# Patient Record
Sex: Male | Born: 1990 | Race: White | Hispanic: No | Marital: Single | Smoking: Never smoker
Health system: Southern US, Community
[De-identification: ages and names within clinical notes are randomized; demographics above are authoritative.]

## PROBLEM LIST (undated history)

## (undated) VITALS — BP 108/74 | HR 75 | Temp 98.2°F | Resp 16 | Ht 71.0 in | Wt 171.0 lb

## (undated) DIAGNOSIS — Z789 Other specified health status: Secondary | ICD-10-CM

## (undated) DIAGNOSIS — F640 Transsexualism: Secondary | ICD-10-CM

## (undated) HISTORY — PX: NO PAST SURGERIES: SHX2092

---
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
## 2009-08-20 ENCOUNTER — Emergency Department (HOSPITAL_BASED_OUTPATIENT_CLINIC_OR_DEPARTMENT_OTHER): Admission: EM | Admit: 2009-08-20 | Discharge: 2009-08-20 | Payer: Self-pay | Admitting: Emergency Medicine

## 2009-08-20 ENCOUNTER — Ambulatory Visit: Payer: Self-pay | Admitting: Diagnostic Radiology

## 2009-09-11 ENCOUNTER — Ambulatory Visit: Payer: Self-pay | Admitting: Interventional Radiology

## 2009-09-11 ENCOUNTER — Emergency Department (HOSPITAL_BASED_OUTPATIENT_CLINIC_OR_DEPARTMENT_OTHER): Admission: EM | Admit: 2009-09-11 | Discharge: 2009-09-11 | Payer: Self-pay | Admitting: Emergency Medicine

## 2010-12-21 LAB — CBC
Platelets: 278 10*3/uL (ref 150–400)
RDW: 12.4 % (ref 11.5–15.5)
WBC: 5.6 10*3/uL (ref 4.0–10.5)

## 2010-12-21 LAB — DIFFERENTIAL
Basophils Absolute: 0 10*3/uL (ref 0.0–0.1)
Lymphocytes Relative: 30 % (ref 12–46)
Lymphs Abs: 1.7 10*3/uL (ref 0.7–4.0)
Neutro Abs: 3.4 10*3/uL (ref 1.7–7.7)
Neutrophils Relative %: 60 % (ref 43–77)

## 2010-12-21 LAB — BASIC METABOLIC PANEL
Calcium: 9.8 mg/dL (ref 8.4–10.5)
Chloride: 103 mEq/L (ref 96–112)
Creatinine, Ser: 0.9 mg/dL (ref 0.4–1.5)
GFR calc non Af Amer: >60 mL/min (ref >60)

## 2010-12-22 LAB — URINALYSIS, ROUTINE W REFLEX MICROSCOPIC
Ketones, ur: NEGATIVE mg/dL
Nitrite: NEGATIVE
Specific Gravity, Urine: 1.013 (ref 1.005–1.030)
Urobilinogen, UA: 0.2 mg/dL (ref 0.0–1.0)
pH: 7 (ref 5.0–8.0)

## 2010-12-22 LAB — RAPID STREP SCREEN (MED CTR MEBANE ONLY): Streptococcus, Group A Screen (Direct): NEGATIVE

## 2011-06-10 IMAGING — CR DG CHEST 2V
2 series · 2 of 2 positions shown · non-contrast
Comparison: None

CLINICAL DATA: Fever, cough, chest pain

CHEST - 2 VIEW

[w chest pa]
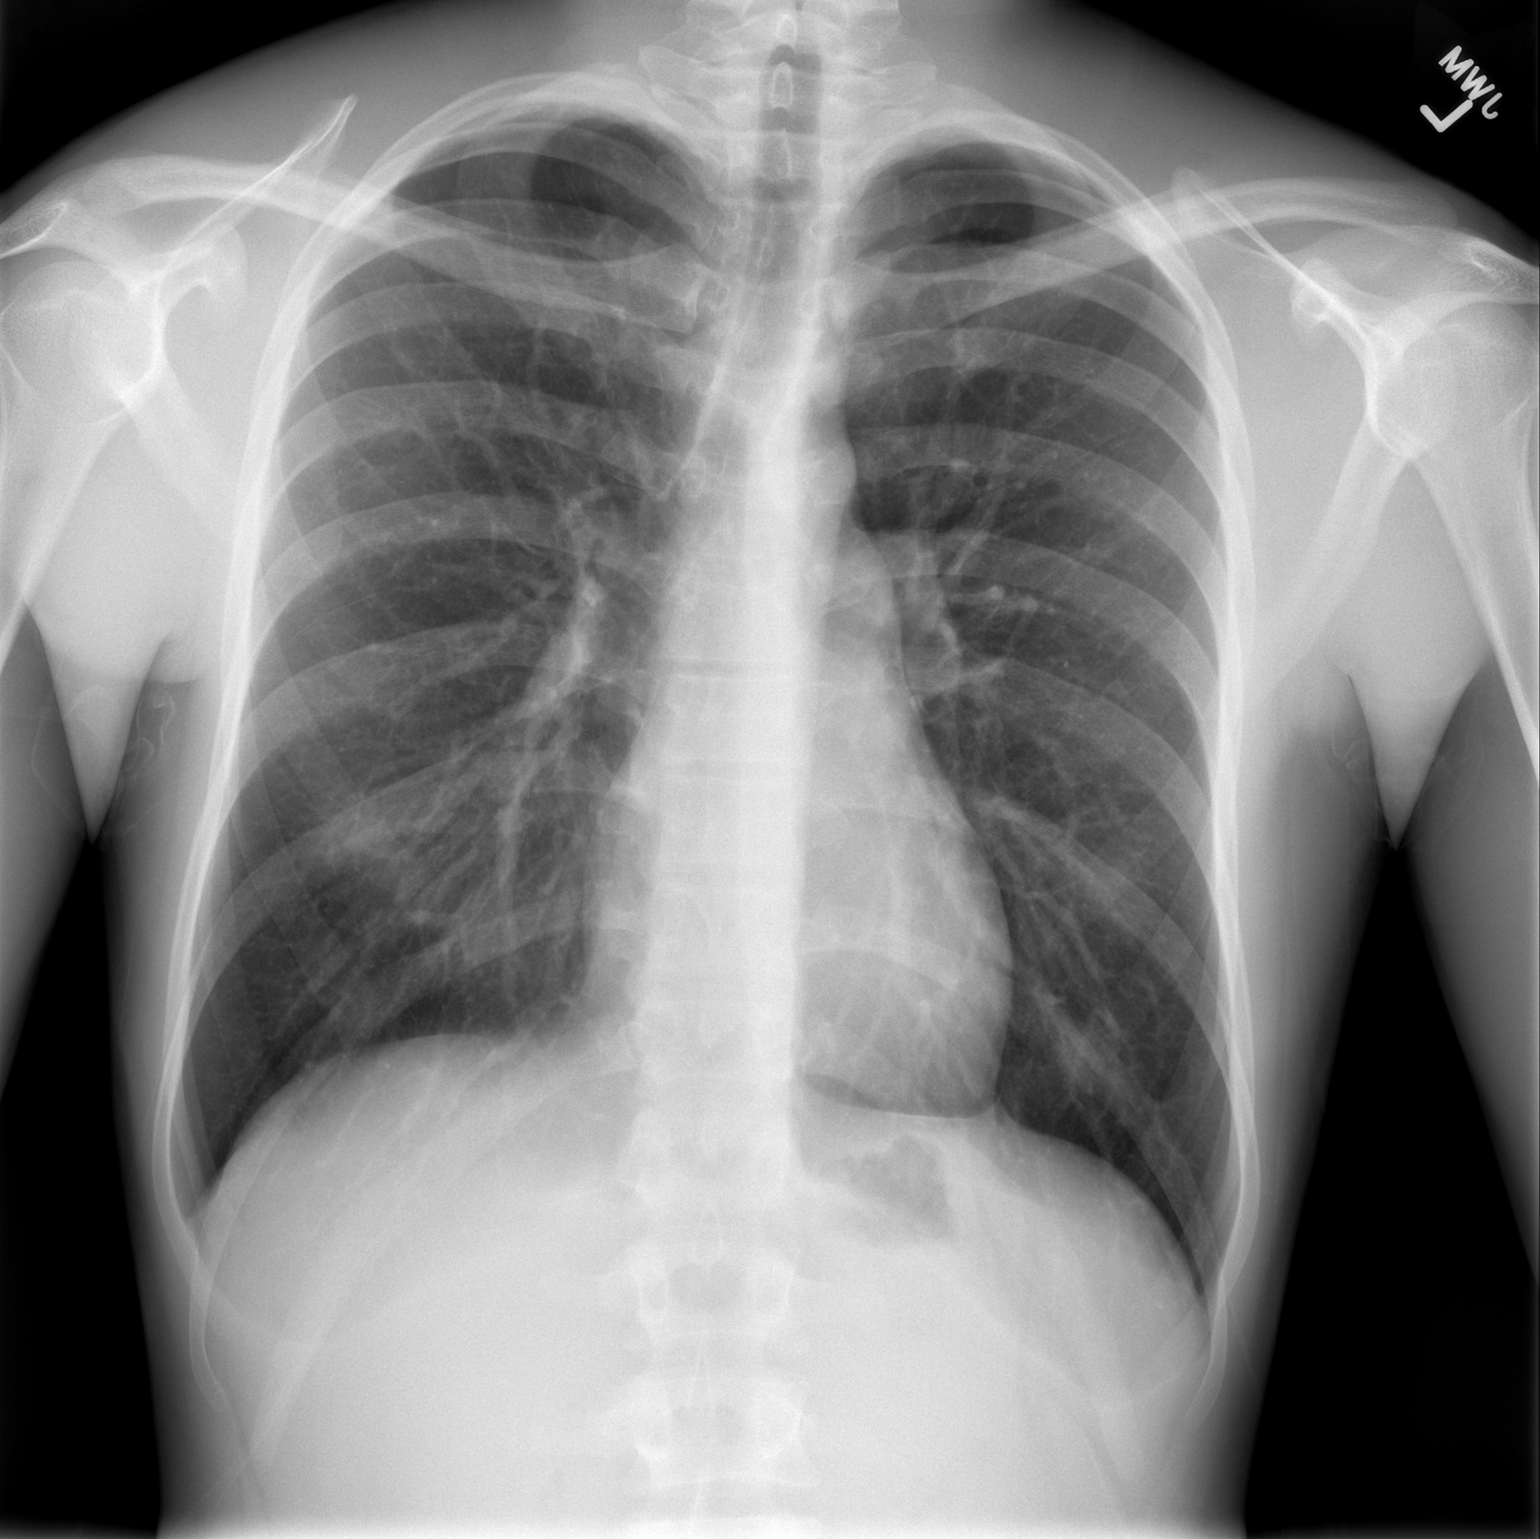

[w chest lat]
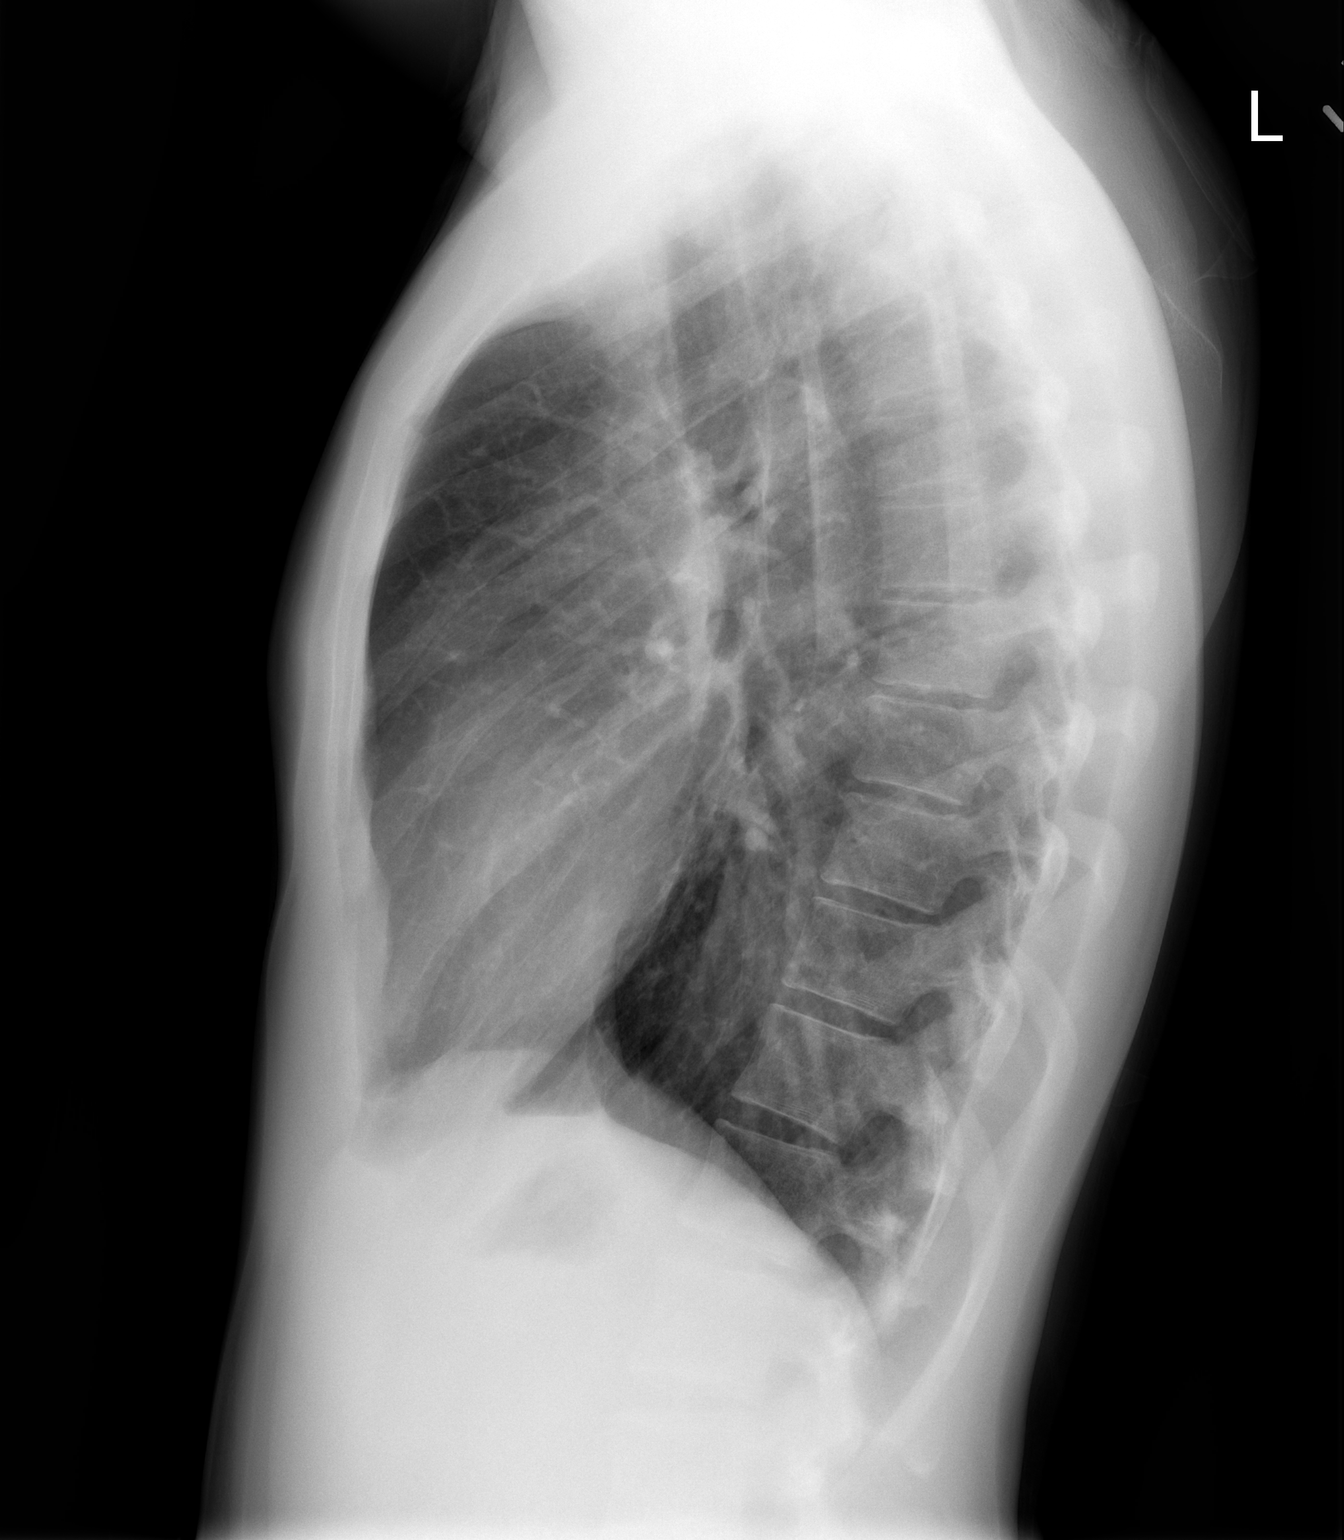

[2 of 2 positions shown; findings below may reference images not displayed]

FINDINGS: The lungs are clear but hyperaerated.  The heart is
within normal limits in size.  No bony abnormality is seen.
IMPRESSION: No active lung disease.  Hyperaeration.

## 2018-08-31 ENCOUNTER — Telehealth: Payer: Self-pay | Admitting: Family Medicine

## 2018-08-31 NOTE — Telephone Encounter (Signed)
Copied from CRM [id number]. Topic: Quick Communication - See Telephone Encounter >> Aug 31, 2018  1:42 PM Jens SomMedley, Jennifer A wrote: CRM for notification. See Telephone encounter for: 08/31/18.  Patient is calling to become a patient of Dr. Clelia CroftShaw was referred by Dr. Ruby ColaPittaway. The Dr is retired. Interested in hormone therapy please advise [phone]

## 2019-03-13 ENCOUNTER — Emergency Department (HOSPITAL_COMMUNITY)
Admission: EM | Admit: 2019-03-13 | Discharge: 2019-03-14 | Disposition: A | Discharge status: Other Acute Inpatient Hospital | Payer: Self-pay | Attending: Emergency Medicine | Admitting: Emergency Medicine

## 2019-03-13 ENCOUNTER — Encounter (HOSPITAL_COMMUNITY): Payer: Self-pay | Admitting: Emergency Medicine

## 2019-03-13 ENCOUNTER — Other Ambulatory Visit: Payer: Self-pay

## 2019-03-13 DIAGNOSIS — Z79899 Other long term (current) drug therapy: Secondary | ICD-10-CM | POA: Insufficient documentation

## 2019-03-13 DIAGNOSIS — F332 Major depressive disorder, recurrent severe without psychotic features: Secondary | ICD-10-CM | POA: Insufficient documentation

## 2019-03-13 DIAGNOSIS — Z03818 Encounter for observation for suspected exposure to other biological agents ruled out: Secondary | ICD-10-CM | POA: Insufficient documentation

## 2019-03-13 DIAGNOSIS — F419 Anxiety disorder, unspecified: Secondary | ICD-10-CM | POA: Insufficient documentation

## 2019-03-13 DIAGNOSIS — F322 Major depressive disorder, single episode, severe without psychotic features: Secondary | ICD-10-CM

## 2019-03-13 HISTORY — DX: Other specified health status: Z78.9

## 2019-03-13 LAB — COMPREHENSIVE METABOLIC PANEL
ALT: 93 U/L — ABNORMAL HIGH (ref 0–44)
AST: 91 U/L — ABNORMAL HIGH (ref 15–41)
Albumin: 4.2 g/dL (ref 3.5–5.0)
Alkaline Phosphatase: 61 U/L (ref 38–126)
Anion gap: 6 (ref 5–15)
BUN: 9 mg/dL (ref 6–20)
CO2: 27 mmol/L (ref 22–32)
Calcium: 9.2 mg/dL (ref 8.9–10.3)
Chloride: 106 mmol/L (ref 98–111)
Creatinine, Ser: 0.85 mg/dL (ref 0.61–1.24)
GFR calc Af Amer: >60 mL/min (ref >60)
GFR calc non Af Amer: >60 mL/min (ref >60)
Glucose, Bld: 89 mg/dL (ref 70–99)
Potassium: 3.6 mmol/L (ref 3.5–5.1)
Sodium: 139 mmol/L (ref 135–145)
Total Bilirubin: 0.8 mg/dL (ref 0.3–1.2)
Total Protein: 6.8 g/dL (ref 6.5–8.1)

## 2019-03-13 LAB — CBC
HCT: 40 % (ref 39.0–52.0)
Hemoglobin: 13.5 g/dL (ref 13.0–17.0)
MCH: 31 pg (ref 26.0–34.0)
MCHC: 33.8 g/dL (ref 30.0–36.0)
MCV: 91.7 fL (ref 80.0–100.0)
Platelets: 342 10*3/uL (ref 150–400)
RBC: 4.36 MIL/uL (ref 4.22–5.81)
RDW: 12.7 % (ref 11.5–15.5)
WBC: 10.5 10*3/uL (ref 4.0–10.5)
nRBC: 0 % (ref 0.0–0.2)

## 2019-03-13 LAB — RAPID URINE DRUG SCREEN, HOSP PERFORMED
Amphetamines: NOT DETECTED
Barbiturates: NOT DETECTED
Benzodiazepines: POSITIVE — AB
Cocaine: NOT DETECTED
Opiates: NOT DETECTED
Tetrahydrocannabinol: NOT DETECTED

## 2019-03-13 LAB — ETHANOL: Alcohol, Ethyl (B): <10 mg/dL (ref <10)

## 2019-03-13 LAB — ACETAMINOPHEN LEVEL: Acetaminophen (Tylenol), Serum: <10 ug/mL — ABNORMAL LOW (ref 10–30)

## 2019-03-13 LAB — SALICYLATE LEVEL: Salicylate Lvl: <7 mg/dL (ref 2.8–30.0)

## 2019-03-13 NOTE — ED Notes (Signed)
Pt changed into scrubs. Pt belongings gathered and placed in locker 3 purple zone. Valuables sealed in envelope and locked up by security. Pt wanded in triage area and given recliner. Pt very cooperative and agreeable.

## 2019-03-13 NOTE — ED Triage Notes (Signed)
Pt states he has recently had a flare in his depression and anxiety for the last few months. Pt is currently on hormone treatments to block Testerone. Pt currently has thoughts of harming self- pt states he has been trying to come up with a plan- pt has thought about getting heroine to over dose.
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
## 2019-03-14 ENCOUNTER — Encounter (HOSPITAL_COMMUNITY): Payer: Self-pay

## 2019-03-14 ENCOUNTER — Inpatient Hospital Stay (HOSPITAL_COMMUNITY)
Admission: AD | Admit: 2019-03-14 | Discharge: 2019-03-18 | DRG: 885 | Disposition: A | Discharge status: Home / Self Care | Payer: Federal, State, Local not specified - Other | Source: Intra-hospital | Attending: Psychiatry | Admitting: Psychiatry

## 2019-03-14 ENCOUNTER — Other Ambulatory Visit: Payer: Self-pay

## 2019-03-14 DIAGNOSIS — Z79899 Other long term (current) drug therapy: Secondary | ICD-10-CM | POA: Diagnosis not present

## 2019-03-14 DIAGNOSIS — F339 Major depressive disorder, recurrent, unspecified: Secondary | ICD-10-CM

## 2019-03-14 DIAGNOSIS — F419 Anxiety disorder, unspecified: Secondary | ICD-10-CM | POA: Diagnosis present

## 2019-03-14 DIAGNOSIS — F332 Major depressive disorder, recurrent severe without psychotic features: Secondary | ICD-10-CM | POA: Diagnosis not present

## 2019-03-14 DIAGNOSIS — G4700 Insomnia, unspecified: Secondary | ICD-10-CM | POA: Diagnosis present

## 2019-03-14 DIAGNOSIS — F640 Transsexualism: Secondary | ICD-10-CM | POA: Diagnosis present

## 2019-03-14 DIAGNOSIS — Z79890 Hormone replacement therapy (postmenopausal): Secondary | ICD-10-CM

## 2019-03-14 DIAGNOSIS — F429 Obsessive-compulsive disorder, unspecified: Secondary | ICD-10-CM | POA: Diagnosis present

## 2019-03-14 DIAGNOSIS — R45851 Suicidal ideations: Secondary | ICD-10-CM | POA: Diagnosis present

## 2019-03-14 HISTORY — DX: Other specified health status: Z78.9

## 2019-03-14 LAB — SARS CORONAVIRUS 2 BY RT PCR (HOSPITAL ORDER, PERFORMED IN ~~LOC~~ HOSPITAL LAB): SARS Coronavirus 2: NEGATIVE

## 2019-03-14 MED ORDER — ESTRADIOL VALERATE 20 MG/ML IM OIL
10.0000 mg | TOPICAL_OIL | INTRAMUSCULAR | Status: DC
  Administered 2019-03-17: 10 mg via INTRAMUSCULAR

## 2019-03-14 MED ORDER — ALUM & MAG HYDROXIDE-SIMETH 200-200-20 MG/5ML PO SUSP: 30.0000 mL | ORAL | Status: DC | PRN

## 2019-03-14 MED ORDER — DUTASTERIDE 0.5 MG PO CAPS: 0.5000 mg | ORAL_CAPSULE | Freq: Every day | ORAL | Status: DC

## 2019-03-14 MED ORDER — DUTASTERIDE 0.5 MG PO CAPS
0.5000 mg | ORAL_CAPSULE | Freq: Every day | ORAL | Status: DC
  Administered 2019-03-15 – 2019-03-18 (×4): 0.5 mg via ORAL

## 2019-03-14 MED ORDER — TRAZODONE HCL 50 MG PO TABS
50.0000 mg | ORAL_TABLET | Freq: Every evening | ORAL | Status: DC | PRN
  Administered 2019-03-14 – 2019-03-17 (×4): 50 mg via ORAL
  Filled 2019-03-14 (×3): qty 1
  Filled 2019-03-14: qty 7
  Filled 2019-03-14: qty 1

## 2019-03-14 MED ORDER — SERTRALINE HCL 50 MG PO TABS
50.0000 mg | ORAL_TABLET | Freq: Every day | ORAL | Status: DC
  Administered 2019-03-15: 08:00:00 50 mg via ORAL
  Filled 2019-03-14 (×2): qty 1

## 2019-03-14 MED ORDER — LORAZEPAM 0.5 MG PO TABS: 0.5000 mg | ORAL_TABLET | Freq: Four times a day (QID) | ORAL | Status: DC | PRN

## 2019-03-14 MED ORDER — ACETAMINOPHEN 325 MG PO TABS: 650.0000 mg | ORAL_TABLET | Freq: Four times a day (QID) | ORAL | Status: DC | PRN

## 2019-03-14 MED ORDER — HYDROXYZINE HCL 25 MG PO TABS
25.0000 mg | ORAL_TABLET | Freq: Three times a day (TID) | ORAL | Status: DC | PRN
  Administered 2019-03-14 – 2019-03-17 (×4): 25 mg via ORAL
  Filled 2019-03-14: qty 1
  Filled 2019-03-14: qty 10
  Filled 2019-03-14 (×3): qty 1

## 2019-03-14 MED ORDER — TRAZODONE HCL 50 MG PO TABS: 50.0000 mg | ORAL_TABLET | Freq: Every evening | ORAL | Status: DC | PRN

## 2019-03-14 MED ORDER — MAGNESIUM HYDROXIDE 400 MG/5ML PO SUSP: 30.0000 mL | Freq: Every day | ORAL | Status: DC | PRN

## 2019-03-14 NOTE — Progress Notes (Signed)
Patient ID: Paul [name], male   DOB: 1991/03/20, 28 y.o.   MRN: [id number]  Admission Note  D) Patient admitted to the adult unit 400 hall at 1315 voluntarily. Patient is a 28 year old male from [facility]. Patient states he is transitioning to become male but prefers male pronouns and the name Paul [name] at this time. Patient presents anxious but was pleasant and cooperative during the admission process. Patient states he is living with roommates but has felt an increase in OCD symptoms and depression that have become debilitating. Patient reports past SI to OD on heroin (although he denies substance abuse) but states he is not suicidal now. Patient denies HI/AVH. While here, patient reports wanting to work on "my suicidal thoughts, hopelessness, OCD and depression".   Skin assessment was completed and unremarkable except for a healing sunburn. Patient belongings searched with no contraband found. Belongings in locker #41. Valuables envelope received from [facility]. Vital signs obtained and were stable. Snacks and fluids offered.    A) Plan of care, unit policies and patient expectations were explained. Written consents obtained. Patient oriented to the unit and their room. Patient placed on standard q15 safety checks. Low fall risk precautions initiated and reviewed with patient. Home medications reviewed with pharmacy.   R) Patient is in no acute distress and verbalizes understanding of information provided. Patient with no concerns at this time. Patient contracts for safety with staff on the unit. Report given to receiving RN.

## 2019-03-14 NOTE — ED Notes (Signed)
Lindon Romp, NP, determined pt meets criteria for inpatient hospitalization. Pt has been accepted at [facility] Room 021-1 and can arrive at 0900. Pt *must* have a COVID test performed prior to being transported from [facility]. Pt's nurse, Stefani Dama, was provided this information at [phone].  Accepting: Lindon Romp, NP Attending: Dr. Suzy Bouchard Call to Report: [phone]
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
## 2019-03-14 NOTE — ED Notes (Signed)
Plan of care: to be inpatient, voluntary.

## 2019-03-14 NOTE — Progress Notes (Signed)
Pt accepted to  [facility]. 406-2 Mordecai Maes, NP is the accepting provider.  Neita Garnet, MD is the attending provider.  Call report to [place] @ [facility] ED notified.   Pt is  Voluntary.  Pt may be transported by [facility]  Pt scheduled  to arrive at [facility] as soon as transport can be arranged.  Areatha Keas. Judi Cong, MSW, [place] Disposition Clinical Social Work [phone] (cell) [phone] (office)

## 2019-03-14 NOTE — Tx Team (Signed)
Initial Treatment Plan 03/14/2019 1:55 PM Paul [name] JEH:[id number]    PATIENT STRESSORS: Other: untreated symptoms of depression/OCD   PATIENT STRENGTHS: Ability for insight Average or above average intelligence Capable of independent living Motivation for treatment/growth Physical Health Supportive family/friends   PATIENT IDENTIFIED PROBLEMS: "Hopelessness"  "Depression"  "OCD"  Suicide Risk               DISCHARGE CRITERIA:  Ability to meet basic life and health needs Adequate post-discharge living arrangements Improved stabilization in mood, thinking, and/or behavior Medical problems require only outpatient monitoring Motivation to continue treatment in a less acute level of care Need for constant or close observation no longer present Reduction of life-threatening or endangering symptoms to within safe limits Safe-care adequate arrangements made Verbal commitment to aftercare and medication compliance  PRELIMINARY DISCHARGE PLAN: Outpatient therapy  PATIENT/FAMILY INVOLVEMENT: This treatment plan has been presented to and reviewed with the patient, Paul [name].  The patient and family have been given the opportunity to ask questions and make suggestions.  Annia Friendly, RN 03/14/2019, 1:55 PM

## 2019-03-14 NOTE — Tx Team (Signed)
Initial Treatment Plan 03/14/2019 1:48 PM [name] [facility]:[id number]    PATIENT STRESSORS: Health problems Traumatic event   PATIENT STRENGTHS: Ability for insight Active sense of humor Average or above average intelligence   PATIENT IDENTIFIED PROBLEMS: "OCD"  anxiety                   DISCHARGE CRITERIA:  Ability to meet basic life and health needs Adequate post-discharge living arrangements Improved stabilization in mood, thinking, and/or behavior  PRELIMINARY DISCHARGE PLAN: Outpatient therapy  PATIENT/FAMILY INVOLVEMENT: This treatment plan has been presented to and reviewed with the patient, Paul [name]. The patient and family have been given the opportunity to ask questions and make suggestions.  Megan Mans, RN 03/14/2019, 1:48 PM

## 2019-03-14 NOTE — H&P (Addendum)
Psychiatric Admission Assessment Adult  Patient Identification: Paul [name] MRN:  [id number] Date of Evaluation:  03/14/2019 Chief Complaint:  "The OCD has really gotten bad." Principal Diagnosis: <principal problem not specified> Diagnosis:  Active Problems:   Episode of recurrent major depressive disorder ([place])  History of Present Illness: Paul [name] is a 28 year old with history of MDD, OCD. He is a male transitioning to male on hormone therapy but still prefers he/him pronouns and to be called Paul [name] at this time. He reports increased OCD symptoms over the last several weeks, including frequent handwashing, checking doors/locks/faucets/lights, adjusting blinds in windows, cleaning feet. He reports obsessions and compulsive behaviors are "taking over my life," causing stress, taking up large amounts of time, and have brought him to tears. 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 He reports anxiety has also increased related to being at home alone more with the pandemic. He is transitioning to male with hormone therapy and reports depression related to his mother's difficulty accepting transgender status, as well as people at his old church telling him that being transgender is a sin. He wants to be male but is unsure if he believes this is wrong based on religious teachings. 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 He states he has taken Xanax about three times in the last two weeks, with last dose two days ago. He reports suicidal ideation with thoughts of obtaining a large dose of heroin to end his life, but denies history of heroin use. Denies other drug/alcohol use. UDS positive for BZDs only. AST, ALT elevated (91, 93). He denies known history of liver dysfunction. Denies history of IV drug use. He is taking estrogen and Adovert. He was taking  spironolactone but stopped two weeks ago related to insomnia. He reports insomnia has improved since that time but still has difficulty sleeping.  Associated Signs/Symptoms: Depression Symptoms:  depressed mood, anhedonia, insomnia, fatigue, hopelessness, suicidal thoughts with specific plan, decreased appetite, (Hypo) Manic Symptoms:  Impulsivity, Irritable Mood, Anxiety Symptoms:  Excessive Worry, Obsessive Compulsive Symptoms:   Checking, Counting, Handwashing,, Psychotic Symptoms:  denies PTSD Symptoms: Negative Total Time spent with patient: 45 minutes  Past Psychiatric History: Long history of OCD and depression. Previously treated with Prozac but discontinued due to feeling emotionally numb. Paxil was discontinued due to side effects. History of punching himself. Denies prior hospitalizations or suicide attempts. Denies history of manic or psychotic symptoms.  Is the patient at risk to self? Yes.    Has the patient been a risk to self in the past 6 months? No.  Has the patient been a risk to self within the distant past? No.  Is the patient a risk to others? No.  Has the patient been a risk to others in the past 6 months? No.  Has the patient been a risk to others within the distant past? No.   Prior Inpatient Therapy:   Prior Outpatient Therapy:    Alcohol Screening: 1. How often do you have a drink containing alcohol?: Never 2. How many drinks containing alcohol do you have on a typical day when you are drinking?: 1 or 2 3. How often do you have six or more drinks on one occasion?: Never AUDIT-C Score: 0 4. How often during the last year have you found that you were not able to stop  drinking once you had started?: Never 5. How often during the last year have you failed to do what was normally expected from you becasue of drinking?: Never 6. How often during the last year have you needed a first drink in the morning to get yourself going after a heavy drinking session?:  Never 7. How often during the last year have you had a feeling of guilt of remorse after drinking?: Never 8. How often during the last year have you been unable to remember what happened the night before because you had been drinking?: Never 9. Have you or someone else been injured as a result of your drinking?: No 10. Has a relative or friend or a doctor or another health worker been concerned about your drinking or suggested you cut down?: No Alcohol Use Disorder Identification Test Final Score (AUDIT): 0 Alcohol Brief Interventions/Follow-up: Patient Refused Substance Abuse History in the last 12 months:  Yes.   Consequences of Substance Abuse: Denies Previous Psychotropic Medications: Yes  Psychological Evaluations: No  Past Medical History:  Past Medical History:  Diagnosis Date  . Male-to-male transgender person   . Medical history non-contributory     Past Surgical History:  Procedure Laterality Date  . NO PAST SURGERIES     Family History: History reviewed. No pertinent family history. Family Psychiatric  History: He is adopted. He thinks his biological mother might have had problems with alcohol. Tobacco Screening: Have you used any form of tobacco in the last 30 days? (Cigarettes, Smokeless Tobacco, Cigars, and/or Pipes): No Social History:  Social History   Substance and Sexual Activity  Alcohol Use Not Currently     Social History   Substance and Sexual Activity  Drug Use Never    Additional Social History:                           Allergies:  No Known Allergies Lab Results:  Results for orders placed or performed during the hospital encounter of 03/13/19 (from the past 48 hour(s))  Comprehensive metabolic panel     Status: Abnormal   Collection Time: 03/13/19  6:22 PM  Result Value Ref Range   Sodium 139 135 - 145 mmol/L   Potassium 3.6 3.5 - 5.1 mmol/L   Chloride 106 98 - 111 mmol/L   CO2 27 22 - 32 mmol/L   Glucose, Bld 89 70 - 99 mg/dL    BUN 9 6 - 20 mg/dL   Creatinine, Ser 0.85 0.61 - 1.24 mg/dL   Calcium 9.2 8.9 - 10.3 mg/dL   Total Protein 6.8 6.5 - 8.1 g/dL   Albumin 4.2 3.5 - 5.0 g/dL   AST 91 (H) 15 - 41 U/L   ALT 93 (H) 0 - 44 U/L   Alkaline Phosphatase 61 38 - 126 U/L   Total Bilirubin 0.8 0.3 - 1.2 mg/dL   GFR calc non Af Amer >60 >60 mL/min   GFR calc Af Amer >60 >60 mL/min   Anion gap 6 5 - 15    Comment: Performed at [facility], [address], [place], [place] [address]  Ethanol     Status: None   Collection Time: 03/13/19  6:22 PM  Result Value Ref Range   Alcohol, Ethyl (B) <10 <10 mg/dL    Comment: (NOTE) Lowest detectable limit for serum alcohol is 10 mg/dL. For medical purposes only. Performed at [facility], [place] [address], [place], [place]  [address]   Salicylate level     Status: None   Collection Time: 03/13/19  6:22 PM  Result Value Ref Range   Salicylate Lvl <6.0 2.8 - 30.0 mg/dL    Comment: Performed at [facility] [address], [place], [place] [address]  Acetaminophen level     Status: Abnormal   Collection Time: 03/13/19  6:22 PM  Result Value Ref Range   Acetaminophen (Tylenol), Serum <10 (L) 10 - 30 ug/mL    Comment: (NOTE) Therapeutic concentrations vary significantly. A range of 10-30 ug/mL  may be an effective concentration for many patients. However, some  are best treated at concentrations outside of this range. Acetaminophen concentrations >150 ug/mL at 4 hours after ingestion  and >50 ug/mL at 12 hours after ingestion are often associated with  toxic reactions. Performed at [facility], [place] [address], [place], [place] [address]   cbc     Status: None   Collection Time: 03/13/19  6:22 PM  Result Value Ref Range   WBC 10.5 4.0 - 10.5 K/uL   RBC 4.36 4.22 - 5.81 MIL/uL   Hemoglobin 13.5 13.0 - 17.0 g/dL   HCT 40.0 39.0 - 52.0 %   MCV 91.7 80.0 - 100.0 fL   MCH 31.0 26.0 - 34.0 pg   MCHC 33.8 30.0 - 36.0 g/dL   RDW 12.7 11.5 - 15.5 %    Platelets 342 150 - 400 K/uL   nRBC 0.0 0.0 - 0.2 %    Comment: Performed at [facility], [place] [address], [place], [place] [address]  Rapid urine drug screen (hospital performed)     Status: Abnormal   Collection Time: 03/13/19  7:15 PM  Result Value Ref Range   Opiates NONE DETECTED NONE DETECTED   Cocaine NONE DETECTED NONE DETECTED   Benzodiazepines POSITIVE (A) NONE DETECTED   Amphetamines NONE DETECTED NONE DETECTED   Tetrahydrocannabinol NONE DETECTED NONE DETECTED   Barbiturates NONE DETECTED NONE DETECTED    Comment: (NOTE) DRUG SCREEN FOR MEDICAL PURPOSES ONLY.  IF CONFIRMATION IS NEEDED FOR ANY PURPOSE, NOTIFY LAB WITHIN 5 DAYS. LOWEST DETECTABLE LIMITS FOR URINE DRUG SCREEN Drug Class                     Cutoff (ng/mL) Amphetamine and metabolites    1000 Barbiturate and metabolites    200 Benzodiazepine                 562 Tricyclics and metabolites     300 Opiates and metabolites        300 Cocaine and metabolites        300 THC                            50 Performed at [facility], [place] [address], [place], [place] [address]   SARS Coronavirus 2 (CEPHEID - Performed in [facility] lab), Hosp Order     Status: None   Collection Time: 03/14/19  7:57 AM   Specimen: Nasopharyngeal Swab  Result Value Ref Range   SARS Coronavirus 2 NEGATIVE NEGATIVE    Comment: (NOTE) If result is NEGATIVE SARS-CoV-2 target nucleic acids are NOT DETECTED. The SARS-CoV-2 RNA is generally detectable in upper and lower  respiratory specimens during the acute phase of infection. The lowest  concentration of SARS-CoV-2 viral copies this assay can detect is 250  copies /  mL. A negative result does not preclude SARS-CoV-2 infection  and should not be used as the sole basis for treatment or other  patient management decisions.  A negative result may occur with  improper specimen collection / handling, submission of specimen other  than nasopharyngeal swab,  presence of viral mutation(s) within the  areas targeted by this assay, and inadequate number of viral copies  (<250 copies / mL). A negative result must be combined with clinical  observations, patient history, and epidemiological information. If result is POSITIVE SARS-CoV-2 target nucleic acids are DETECTED. The SARS-CoV-2 RNA is generally detectable in upper and lower  respiratory specimens dur ing the acute phase of infection.  Positive  results are indicative of active infection with SARS-CoV-2.  Clinical  correlation with patient history and other diagnostic information is  necessary to determine patient infection status.  Positive results do  not rule out bacterial infection or co-infection with other viruses. If result is PRESUMPTIVE POSTIVE SARS-CoV-2 nucleic acids MAY BE PRESENT.   A presumptive positive result was obtained on the submitted specimen  and confirmed on repeat testing.  While 2019 novel coronavirus  (SARS-CoV-2) nucleic acids may be present in the submitted sample  additional confirmatory testing may be necessary for epidemiological  and / or clinical management purposes  to differentiate between  SARS-CoV-2 and other Sarbecovirus currently known to infect humans.  If clinically indicated additional testing with an alternate test  methodology [phone]) is advised. The SARS-CoV-2 RNA is generally  detectable in upper and lower respiratory sp ecimens during the acute  phase of infection. The expected result is Negative. Fact Sheet for Patients:  StrictlyIdeas.no Fact Sheet for Healthcare Providers: BankingDealers.co.za This test is not yet approved or cleared by the [place] FDA and has been authorized for detection and/or diagnosis of SARS-CoV-2 by FDA under an Emergency Use Authorization (EUA).  This EUA will remain in effect (meaning this test can be used) for the duration of the COVID-19 declaration under  Section 564(b)(1) of the Act, 21 U.S.C. section 360bbb-3(b)(1), unless the authorization is terminated or revoked sooner. Performed at [facility], [place] [address] [place], [place] [address]     Blood Alcohol level:  Lab Results  Component Value Date   ETH <10 79/10/4095    Metabolic Disorder Labs:  No results found for: HGBA1C, MPG No results found for: PROLACTIN No results found for: CHOL, TRIG, HDL, CHOLHDL, VLDL, LDLCALC  Current Medications: Current Facility-Administered Medications  Medication Dose Route Frequency Provider Last Rate Last Dose  . [START ON 03/15/2019] dutasteride (AVODART) capsule 0.5 mg  0.5 mg Oral Daily Estanislao Harmon, Myer Peer, MD      . Derrill Memo ON 03/17/2019] estradiol valerate (DELESTROGEN) 20 MG/ML injection 10 mg  10 mg Intramuscular Weekly Aziel Morgan, Myer Peer, MD      . LORazepam (ATIVAN) tablet 0.5 mg  0.5 mg Oral Q6H PRN Melquiades Kovar, Myer Peer, MD      . Derrill Memo ON 03/15/2019] sertraline (ZOLOFT) tablet 50 mg  50 mg Oral Daily Laira Penninger, Myer Peer, MD      . traZODone (DESYREL) tablet 50 mg  50 mg Oral QHS PRN Lequan Dobratz, Myer Peer, MD       PTA Medications: Medications Prior to Admission  Medication Sig Dispense Refill Last Dose  . dutasteride (AVODART) 0.5 MG capsule Take 0.5 mg by mouth daily.     Marland Kitchen ESTRADIOL VALERATE IM Inject 0.5 mLs into the muscle every Saturday. 142m/5mL Concentration  Musculoskeletal: Strength & Muscle Tone: within normal limits Gait & Station: normal Patient leans: N/A  Psychiatric Specialty Exam: Physical Exam  Nursing note and vitals reviewed. Constitutional: He is oriented to person, place, and time. He appears well-developed and well-nourished.  Cardiovascular: Normal rate.  Respiratory: Effort normal.  Neurological: He is alert and oriented to person, place, and time.    Review of Systems  Constitutional: Negative.   Respiratory: Negative for cough and shortness of breath.   Cardiovascular: Negative for chest pain.   Gastrointestinal: Negative for abdominal pain, diarrhea, nausea and vomiting.  Neurological: Negative for tremors and headaches.  Psychiatric/Behavioral: Positive for depression, substance abuse (Xanax) and suicidal ideas. Negative for hallucinations. The patient is not nervous/anxious and does not have insomnia.     Blood pressure 113/74, pulse 69, temperature 98.4 F (36.9 C), temperature source Oral, resp. rate 18, height _0  (1.803 m), weight 71.7 kg, SpO2 100 %.Body mass index is 22.04 kg/m.  See MD's admission SRA    Treatment Plan Summary: Daily contact with patient to assess and evaluate symptoms and progress in treatment and Medication management   Inpatient hospitalization.  See MD's admission SRA for medication management.  Patient will participate in the therapeutic group milieu.  Discharge disposition in progress.   Observation Level/Precautions:  15 minute checks  Laboratory:  Reviewed  Psychotherapy:  Group therapy  Medications:  See MAR  Consultations:  PRN  Discharge Concerns:  Safety and stabilization  Estimated LOS: 3-5 days  Other:     Physician Treatment Plan for Primary Diagnosis: <principal problem not specified> Long Term Goal(s): Improvement in symptoms so as ready for discharge  Short Term Goals: Ability to identify changes in lifestyle to reduce recurrence of condition will improve, Ability to verbalize feelings will improve and Ability to disclose and discuss suicidal ideas  Physician Treatment Plan for Secondary Diagnosis: Active Problems:   Episode of recurrent major depressive disorder ([place])  Long Term Goal(s): Improvement in symptoms so as ready for discharge  Short Term Goals: Ability to demonstrate self-control will improve and Ability to identify and develop effective coping behaviors will improve  I certify that inpatient services furnished can reasonably be expected to improve the patient's condition.    Connye Burkitt,  NP 6/24/20205:34 PM   I have discussed case with NP and have met with patient  Agree with NP note and assessment  27, single,identifies as male transitioning to male.  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 Presented to hospital voluntarily . Reports worsening anxiety and  depression. Describes OCD type symptoms such as increased obsessions regarding faucets, light switches, water getting on the floor, leading to checking/rechecking behaviors. States " for example if the light is on and I turn it off, I have to look at the switch and repeat it is O-F- F, and even then I need to come back and check again". Endorses some neuro-vegetative symptoms- describes anhedonia, depression, decreased energy, erratic appetite. Also reports recent suicidal ideations, and has had thoughts of intentionally overdosing on heroin. ( Denies opiate /heroin abuse ). Denies psychotic symptoms.  No prior psychiatric admissions, denies history of suicide attempts, denies history of self cutting, does endorse past history of self punching. No history of psychosis. Reports history of chronic depression over the last 2-3 years, although does endorse periods of improvement. Does not endorse any clear history of mania. As noted, endorses  history of OCD type symptoms. In the past was tried on Prozac and on Paxil.States Paxil caused withdrawal symptoms when stopped. Denies medical illnesses, NKDA. Does not smoke. Denies alcohol abuse or drug abuse . 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 States has taken less than before and states has used 2 x ( at 0.5 mgr)  over the last 2 weeks or so, states " it kind of stopped working for me". Home medications- Estradiol, Dutasteride . Recently stopped Spironolactone. Patient reports was adopted at age 51. States biological mother had history of alcohol use disorder.  Dx- MDD, consider OCD .  Plan-- Inpatient  admission. We discussed medication options- states interested in Zoloft, particularly. States " I have heard good things about it". Start Zoloft 50 mgrs QDAY. Trazodone 50 mgrs QHS PRN for insomnia Reports using Xanax x 2 times over the last two weeks and is not presenting with symptoms of WDL. No current indication for detox protocol. Will start Ativan 0.5 mgrs Q 6 hours PRN for anxiety as needed.  Check TSH.

## 2019-03-14 NOTE — ED Notes (Signed)
TTS at bedside. 

## 2019-03-14 NOTE — ED Notes (Signed)
Pt awake and alert.  Calm and very pleasant.  States he can't sleep but otherwise is having no current difficulty.

## 2019-03-14 NOTE — BH Assessment (Addendum)
Tele Assessment Note   Patient Name: Paul [name] MRN: [id number] Referring Physician: Dr. Thayer Jew, MD Location of Patient: [facility] Location of Provider: [facility]  Cire Clute is a 28 y.o. male who came to [facility] due to having ongoing OCD symptoms that have recently "flared up really bad." Pt states he has had OCD symptoms for several years but that he was only recently diagnosed with the d/o. Pt shares things have been "terrible" since 2017 and that he'll cry and scream due to having such severe symptoms, which include turning on and off his light, checking and re-checking his faucets, not wanting to touch anything, not wanting others to touch anything, cleaning and re-cleaning surfaces, etc. 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 He shares he had such horrible despair and depression that he began to consider killing himself; pt began considering ways to kill himself and decided that purchasing heroin and shooting up for an overdose would be easiest, though pt is not a heroin user.  Pt shares he has never attempted to kill himself and he has never been hospitalized for mental health reasons in the past. He denies SI, AH, and engagement in the legal system. Pt states he once experienced VH due to a lack of sleep. He shares he has access to knives for weapons but denies he has access for guns. Pt has a hx of using Adderal, marijuana, and Xanax, though he has no SA treatment hx.  Pt declined to provide clinician verbal consent to contact anyone for collateral, stating he provided more information to clinician than anyone else knows.  Pt is oriented x4. His recent and remote memory is intact. Pt was friendly and cooperative throughout the assessment process. Pt's insight, judgement, and impulse control is impaired at this time.   Diagnosis: F42, Obsessive-compulsive disorder, F33.2, Major depressive disorder, Recurrent  episode, Severe   Past Medical History:  Past Medical History:  Diagnosis Date  . Male-to-male transgender person     History reviewed. No pertinent surgical history.  Family History: No family history on file.  Social History:  has no history on file for tobacco, alcohol, and drug.  Additional Social History:  Alcohol / Drug Use Pain Medications: Please see MAR Prescriptions: Please see MAR Over the Counter: Please see MAR History of alcohol / drug use?: Yes Longest period of sobriety (when/how long): Unknown Substance #1 Name of Substance 1: Adderall 1 - Age of First Use: Unknown 1 - Amount (size/oz): Unknown 1 - Frequency: Unknown 1 - Duration: Unknown 1 - Last Use / Amount: Unknown Substance #2 Name of Substance 2: Marijuana 2 - Age of First Use: Unknown 2 - Amount (size/oz): Unknown 2 - Frequency: Unknown 2 - Duration: Unknown 2 - Last Use / Amount: Unknown Substance #3 Name of Substance 3: Xanax 3 - Age of First Use: Unknown 3 - Amount (size/oz): Unknown 3 - Frequency: Unknown 3 - Duration: Unknown 3 - Last Use / Amount: Unknown  CIWA: CIWA-Ar BP: 134/80 Pulse Rate: 70 COWS:    Allergies: No Known Allergies  Home Medications: (Not in a hospital admission)   OB/GYN Status:  No LMP for male patient.  General Assessment Data Assessment unable to be completed: Yes Reason for not completing assessment: Multiple walk-ins/assessments ordered simultaneously Location of Assessment: [facility] ED TTS Assessment: In system Is this a Tele or Face-to-Face Assessment?: Tele Assessment Is this an Initial Assessment or a  Re-assessment for this encounter?: Initial Assessment Patient Accompanied by:: N/A Language Other than English: No Living Arrangements: Other (Comment)(Pt lives with a married couple friend of the family) What gender do you identify as?: (Currently transitioning MTF, prefers male pronouns ATM) Marital status: Single Maiden name: Laura Pregnancy  Status: No Living Arrangements: Non-relatives/Friends Can pt return to current living arrangement?: Yes Admission Status: Voluntary Is patient capable of signing voluntary admission?: Yes Referral Source: Self/Family/Friend Insurance type: None     Crisis Care Plan Living Arrangements: Non-relatives/Friends Legal Guardian: Other:(Self) Name of Psychiatrist: None Name of Therapist: None  Education Status Is patient currently in school?: No Is the patient employed, unemployed or receiving disability?: Unemployed  Risk to self with the past 6 months Suicidal Ideation: Yes-Currently Present Has patient been a risk to self within the past 6 months prior to admission? : No Suicidal Intent: Yes-Currently Present Has patient had any suicidal intent within the past 6 months prior to admission? : No Is patient at risk for suicide?: Yes Suicidal Plan?: Yes-Currently Present Has patient had any suicidal plan within the past 6 months prior to admission? : No Specify Current Suicidal Plan: Pt has thought about o/d on heroin (though hasn't used) Access to Means: Yes Specify Access to Suicidal Means: Pt has a friend who has used heroin, has access What has been your use of drugs/alcohol within the last 12 months?: Pt acknowledges some use of prescription substances, marijuana Previous Attempts/Gestures: No How many times?: 0 Other Self Harm Risks: Pt acknowledges impulsivity and OCD behaviors Triggers for Past Attempts: None known Intentional Self Injurious Behavior: Cutting, Bruising Comment - Self Injurious Behavior: Pt has engaged in NSSIB via cutting and bruising Family Suicide History: No Recent stressful life event(s): Other (Comment)(Transitioning MTF, lack of healthy coping skills, OCD) Persecutory voices/beliefs?: No Depression: Yes Depression Symptoms: Despondent, Insomnia, Tearfulness, Isolating, Fatigue, Guilt, Feeling worthless/self pity, Feeling angry/irritable Substance  abuse history and/or treatment for substance abuse?: No Suicide prevention information given to non-admitted patients: Not applicable  Risk to Others within the past 6 months Homicidal Ideation: No Does patient have any lifetime risk of violence toward others beyond the six months prior to admission? : No Thoughts of Harm to Others: No Current Homicidal Intent: No Current Homicidal Plan: No Access to Homicidal Means: No Identified Victim: None noted History of harm to others?: No Assessment of Violence: On admission Violent Behavior Description: None noted Does patient have access to weapons?: Yes (Comment)(Pt states he has access to knives) Criminal Charges Pending?: No Does patient have a court date: No Is patient on probation?: No  Psychosis Hallucinations: None noted Delusions: None noted  Mental Status Report Appearance/Hygiene: In scrubs Eye Contact: Good Motor Activity: Unremarkable Speech: Logical/coherent Level of Consciousness: Alert Mood: Depressed, Sullen Affect: Appropriate to circumstance Anxiety Level: Moderate Thought Processes: Coherent, Relevant Judgement: Partial Orientation: Person, Place, Time, Situation Obsessive Compulsive Thoughts/Behaviors: Severe  Cognitive Functioning Concentration: Fair Memory: Recent Intact, Remote Intact Is patient IDD: No Insight: Fair Impulse Control: Poor Appetite: Fair Have you had any weight changes? : No Change Sleep: Increased Total Hours of Sleep: 8 Vegetative Symptoms: None  ADLScreening [facility] Assessment Services) Patient's cognitive ability adequate to safely complete daily activities?: Yes Patient able to express need for assistance with ADLs?: Yes Independently performs ADLs?: Yes (appropriate for developmental age)  Prior Inpatient Therapy Prior Inpatient Therapy: No  Prior Outpatient Therapy Prior Outpatient Therapy: No Does patient have an ACCT team?: No Does patient have Intensive In-House  Services?  :  No Does patient have [facility] services? : No Does patient have P4CC services?: No  ADL Screening (condition at time of admission) Patient's cognitive ability adequate to safely complete daily activities?: Yes Is the patient deaf or have difficulty hearing?: No Does the patient have difficulty seeing, even when wearing glasses/contacts?: No Does the patient have difficulty concentrating, remembering, or making decisions?: Yes Patient able to express need for assistance with ADLs?: Yes Does the patient have difficulty dressing or bathing?: No Independently performs ADLs?: Yes (appropriate for developmental age) Does the patient have difficulty walking or climbing stairs?: No Weakness of Legs: None Weakness of Arms/Hands: None  Home Assistive Devices/Equipment Home Assistive Devices/Equipment: None  Therapy Consults (therapy consults require a physician order) PT Evaluation Needed: No OT Evalulation Needed: No SLP Evaluation Needed: No Abuse/Neglect Assessment (Assessment to be complete while patient is alone) Abuse/Neglect Assessment Can Be Completed: Yes Physical Abuse: Denies Verbal Abuse: Denies Sexual Abuse: Denies Exploitation of patient/patient's resources: Denies Self-Neglect: Denies Values / Beliefs Cultural Requests During Hospitalization: None Spiritual Requests During Hospitalization: None Consults Spiritual Care Consult Needed: No Social Work Consult Needed: No Regulatory affairs officer (For Healthcare) Does Patient Have a Medical Advance Directive?: No Would patient like information on creating a medical advance directive?: No - Patient declined        Disposition: Lindon Romp, NP, reviewed pt's chart and information and determined pt meets criteria for inpatient hospitalization. A 400-Hall bed will be available for pt later today at [facility]. Pt's nurse, Stefani Dama, was provided this information at [phone].   Disposition Initial Assessment Completed  for this Encounter: Yes  This service was provided via telemedicine using a 2-way, interactive audio and video technology.  Names of all persons participating in this telemedicine service and their role in this encounter. Name: Candice Camp Role: Patient  Name: Lindon Romp Role: Nurse Practitioner  Name: Windell Hummingbird Role: Clinician    Dannielle Burn 03/14/2019 5:11 AM

## 2019-03-15 LAB — LIPID PANEL
Cholesterol: 186 mg/dL (ref 0–200)
HDL: 52 mg/dL (ref >40)
LDL Cholesterol: 117 mg/dL — ABNORMAL HIGH (ref 0–99)
Total CHOL/HDL Ratio: 3.6 RATIO
Triglycerides: 87 mg/dL (ref <150)
VLDL: 17 mg/dL (ref 0–40)

## 2019-03-15 LAB — HEMOGLOBIN A1C
Hgb A1c MFr Bld: 4.6 % — ABNORMAL LOW (ref 4.8–5.6)
Mean Plasma Glucose: 85.32 mg/dL

## 2019-03-15 LAB — TSH
TSH: 2.607 u[IU]/mL (ref 0.350–4.500)
TSH: 2.836 u[IU]/mL (ref 0.350–4.500)

## 2019-03-15 MED ORDER — SERTRALINE HCL 25 MG PO TABS
75.0000 mg | ORAL_TABLET | Freq: Every day | ORAL | Status: DC
  Administered 2019-03-16 – 2019-03-18 (×3): 75 mg via ORAL
  Filled 2019-03-15: qty 21
  Filled 2019-03-15 (×6): qty 3

## 2019-03-15 NOTE — BHH Counselor (Signed)
Adult Comprehensive Assessment  Patient ID: Paul [name], male   DOB: 29-May-1991, 28 y.o.   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 [employer] Employment / Job issues: Unemployed Family Relationships: Reports having a strained relationship with his familu after he revealed he was a transgender male Surveyor, quantityinancial / Lack of resources (include bankruptcy): No income Housing / Lack of housing: [place]; Denies any current stressors Physical health (include injuries & life threatening diseases): Patient denies any current stressors Social relationships: Patient reports his social relationships have become strained due to his OCD; Reports his closest friends become frustrated with his symptoms. Substance abuse: Endorsed using unprescribed Xanax Bereavement / Loss: Patient denies any current stresors  Living/Environment/Situation:  Living Arrangements: Non-relatives/Friends Living conditions (as described by patient or guardian): "good" Who else lives in the home?: Two roommates How long has patient lived in current situation?: Since September 2019 What is atmosphere in current home: Comfortable, Supportive  Family History:  Marital status: Single Are you sexually active?: No What is your sexual orientation?: Heterosexual Has your sexual activity been affected by drugs, alcohol, medication, or emotional stress?: No Does patient have children?: No  Childhood History:  By whom was/is the patient raised?: Adoptive parents Additional childhood history information: Reports being adopted at the age of 28 years old Description of  patient's relationship with caregiver when they were a child: Patient reports having a close relationship with his mother, however he reports his relationship with his father was distant Patient's description of current relationship with people who raised him/her: Patient reports his father is currently deceased, however his relationship with his mother is good. He reports their relationship has become distant since he revealed he was a transgender male How were you disciplined when you got in trouble as a child/adolescent?: Whoopings Does patient have siblings?: Yes Number of Siblings: 3 Description of patient's current relationship with siblings: Patient reports not having a relationship with two of his siblings, however he and his adoptive brother have a close relationship Did patient suffer any verbal/emotional/physical/sexual abuse as a child?: No Did patient suffer from severe childhood neglect?: No Has patient ever been sexually abused/assaulted/raped as an adolescent or adult?: No Was the patient ever a victim of a crime or a disaster?: No Witnessed domestic violence?: No Has patient been effected by domestic violence as an adult?: No  Education:  Highest grade of school patient has completed: Some college Currently a Consulting civil engineerstudent?: No Learning disability?: No  Employment/Work Situation:   Employment situation: Unemployed Patient's job has been impacted by current illness: No What is the longest time patient has a held a job?: 7 years Where was the patient employed at that time?: [employer] Did You Receive Any Psychiatric Treatment/Services While in [employer]?: No Are There Guns or Other Weapons in Your Home?: No  Financial Resources:   Financial resources: No income Does patient have a Lawyerrepresentative payee or guardian?: No  Alcohol/Substance Abuse:   What has been your use of drugs/alcohol within the last 12 months?: Unprescribed Xanax use; Did not disclose frequency or  amounts If attempted suicide, did drugs/alcohol play a role in this?: No Alcohol/Substance Abuse Treatment Hx: Denies past history Has alcohol/substance abuse  ever caused legal problems?: No  Social Support System:   Patient's Community Support System: Good Describe Community Support System: "Roommates and best friend" Type of faith/religion: Christianity How does patient's faith help to cope with current illness?: Prayer  Leisure/Recreation:   Leisure and Hobbies: "Hanging with my friends"  Strengths/Needs:   What is the patient's perception of their strengths?: "I like to make people laugh and I am inclusive" Patient states they can use these personal strengths during their treatment to contribute to their recovery: Yes Patient states these barriers may affect/interfere with their treatment: No Patient states these barriers may affect their return to the community: No Other important information patient would like considered in planning for their treatment: No  Discharge Plan:   Currently receiving community mental health services: No Patient states concerns and preferences for aftercare planning are: Expressed interest in outpatient medication management and therapy referrals Patient states they will know when they are safe and ready for discharge when: To be determined Does patient have access to transportation?: Yes Does patient have financial barriers related to discharge medications?: Yes Patient description of barriers related to discharge medications: No income, no health insurance Will patient be returning to same living situation after discharge?: Yes  Summary/Recommendations:   Summary and Recommendations (to be completed by the evaluator): Paul [name] is a 28 year old male who is diagnosed with Recurrent, Major depressive disorder. He presented to the hospital seeking treatment for ongoing OCD symptoms and contributing depressive symptoms. During the assessment, Paul [name] was  pleasant and cooperative with providing information. Paul [name] reports that he came to the hospital becuase he felt overwhelmed with his OCD "flares". He reports he wanted to get his symptoms under control, so that he will not be a burden on his loved ones. Paul [name] reports that he would like to learn coping skills while in the hospital. Paul [name] can benefit from crisis stabilization, medication management, therapeutic milieu and referral services.  Marylee Floras. 03/15/2019

## 2019-03-15 NOTE — Progress Notes (Signed)
Patient ID: Paul [name], male   DOB: 11-Jun-1991, 28 y.o.   MRN: [id number] Pt has been anxious, fidgety. Pleasant and cooperative on approach. Pt has been positive for unit activities with minimal prompting and is active in the milieu. Pt requires minimal prompting to stay on task. Pt contracts for safety. No distress noted.

## 2019-03-15 NOTE — Progress Notes (Signed)
D: Pt denies SI/HI/AV. Pt is pleasant and cooperative. Pt stated he was better, pt visible on the unit interacting with peers A: Pt was offered support and encouragement. Pt was given scheduled medications. Pt was encourage to attend groups. Q 15 minute checks were done for safety.   R:Pt attends groups and interacts well with peers and staff. Pt is taking medication. Pt has no complaints.Pt receptive to treatment and safety maintained on unit.

## 2019-03-15 NOTE — BHH Suicide Risk Assessment (Signed)
[place] INPATIENT:  Family/Significant Other Suicide Prevention Education  Suicide Prevention Education:  Patient Refusal for Family/Significant Other Suicide Prevention Education: The patient Paul [name] has refused to provide written consent for family/significant other to be provided Family/Significant Other Suicide Prevention Education during admission and/or prior to discharge.  Physician notified.  SPE completed with patient, as patient refused to consent to family contact. SPI pamphlet provided to pt and pt was encouraged to share information with support network, ask questions, and talk about any concerns relating to SPE. Patient denies access to guns/firearms and verbalized understanding of information provided. Mobile Crisis information also provided to patient.    Marylee Floras 03/15/2019, 10:16 AM

## 2019-03-15 NOTE — Progress Notes (Signed)
[facility] MD Progress Note  03/15/2019 10:21 AM Paul [name]  MRN:  [id number] Subjective: Patient reports some improvement.  Thus far tolerating medications well.  Does report persistent/ongoing OCD type symptoms and states spent a significant amount of time in bathroom/shower related to obsessive thoughts regarding faucets/water.  Denies suicidal ideations. Objective: I have discussed case with treatment team and I met with patient.  28 year old, identifies self as male currently transitioning to male.  Presented due to worsening anxiety, OCD symptoms, depression, neurovegetative symptoms, recent suicidal ideations. Today reports some improvement and although still depressed affect appears more reactive.  Smiles at times appropriately during session.  Denies suicidal ideations at this time and contracts for safety on unit. Does describe persistent obsessive thoughts/compulsions as described above.  We reviewed current regimen.  Has been started on Zoloft which thus far is tolerating well.  We also reviewed potential benefit of psychotherapy in addition to medication management for long-term management of anxiety disorder. Reports that family members having some difficulty adapting to transgender status has been contributing stressor, but states that feels ego-syntonic and comfortable with decision.  Feels optimistic that "they will come around". Labs reviewed-lipid panel unremarkable, TSH 2.83.,  Hemoglobin A1c 4.6.  Principal Problem:  Depression, OCD Diagnosis: Active Problems:   Episode of recurrent major depressive disorder (HCC)   Severe recurrent major depression without psychotic features ([place])  Total Time spent with patient: 20 minutes  Past Psychiatric History:   Past Medical History:  Past Medical History:  Diagnosis Date  . Male-to-male transgender person   . Medical history non-contributory     Past Surgical History:  Procedure Laterality Date  . NO PAST SURGERIES      Family History: History reviewed. No pertinent family history. Family Psychiatric  History:  Social History:  Social History   Substance and Sexual Activity  Alcohol Use Not Currently     Social History   Substance and Sexual Activity  Drug Use Never    Social History   Socioeconomic History  . Marital status: Single    Spouse name: Not on file  . Number of children: Not on file  . Years of education: Not on file  . Highest education level: Not on file  Occupational History  . Not on file  Social Needs  . Financial resource strain: Not on file  . Food insecurity    Worry: Not on file    Inability: Not on file  . Transportation needs    Medical: Not on file    Non-medical: Not on file  Tobacco Use  . Smoking status: Never Smoker  . Smokeless tobacco: Never Used  Substance and Sexual Activity  . Alcohol use: Not Currently  . Drug use: Never  . Sexual activity: Not Currently  Lifestyle  . Physical activity    Days per week: Not on file    Minutes per session: Not on file  . Stress: Not on file  Relationships  . Social Herbalist on phone: Not on file    Gets together: Not on file    Attends religious service: Not on file    Active member of club or organization: Not on file    Attends meetings of clubs or organizations: Not on file    Relationship status: Not on file  Other Topics Concern  . Not on file  Social History Narrative  . Not on file   Additional Social History:   Sleep: improving   Appetite:  improving  Current Medications: Current Facility-Administered Medications  Medication Dose Route Frequency Provider Last Rate Last Dose  . acetaminophen (TYLENOL) tablet 650 mg  650 mg Oral Q6H PRN Lindon Romp A, NP      . alum & mag hydroxide-simeth (MAALOX/MYLANTA) 200-200-20 MG/5ML suspension 30 mL  30 mL Oral Q4H PRN Lindon Romp A, NP      . dutasteride (AVODART) capsule 0.5 mg  0.5 mg Oral Daily Cobos, Myer Peer, MD   0.5 mg at  03/15/19 0828  . [START ON 03/17/2019] estradiol valerate (DELESTROGEN) 20 MG/ML injection 10 mg  10 mg Intramuscular Weekly Cobos, Fernando A, MD      . hydrOXYzine (ATARAX/VISTARIL) tablet 25 mg  25 mg Oral TID PRN Rozetta Nunnery, NP   25 mg at 03/14/19 2158  . LORazepam (ATIVAN) tablet 0.5 mg  0.5 mg Oral Q6H PRN Cobos, Myer Peer, MD      . magnesium hydroxide (MILK OF MAGNESIA) suspension 30 mL  30 mL Oral Daily PRN Rozetta Nunnery, NP      . Derrill Memo ON 03/16/2019] sertraline (ZOLOFT) tablet 75 mg  75 mg Oral Daily Cobos, Myer Peer, MD      . traZODone (DESYREL) tablet 50 mg  50 mg Oral QHS PRN Cobos, Myer Peer, MD   50 mg at 03/14/19 2158    Lab Results:  Results for orders placed or performed during the hospital encounter of 03/14/19 (from the past 48 hour(s))  TSH     Status: None   Collection Time: 03/15/19  6:42 AM  Result Value Ref Range   TSH 2.836 0.350 - 4.500 uIU/mL    Comment: Performed by a 3rd Generation assay with a functional sensitivity of <=0.01 uIU/mL. Performed at [facility], [place] [address] [place], [place] [address]   Hemoglobin A1c     Status: Abnormal   Collection Time: 03/15/19  6:42 AM  Result Value Ref Range   Hgb A1c MFr Bld 4.6 (L) 4.8 - 5.6 %    Comment: (NOTE) Pre diabetes:          5.7%-6.4% Diabetes:              >6.4% Glycemic control for   <7.0% adults with diabetes    Mean Plasma Glucose 85.32 mg/dL    Comment: Performed at [facility] [address], [place], [place] [address]  Lipid panel     Status: Abnormal   Collection Time: 03/15/19  6:42 AM  Result Value Ref Range   Cholesterol 186 0 - 200 mg/dL   Triglycerides 87 <150 mg/dL   HDL 52 >40 mg/dL   Total CHOL/HDL Ratio 3.6 RATIO   VLDL 17 0 - 40 mg/dL   LDL Cholesterol 117 (H) 0 - 99 mg/dL    Comment:        Total Cholesterol/HDL:CHD Risk Coronary Heart Disease Risk Table                     Men   Women  1/2 Average Risk   3.4   3.3  Average Risk        5.0   4.4  2 X Average Risk   9.6   7.1  3 X Average Risk  23.4   11.0        Use the calculated Patient Ratio above and the CHD Risk Table to determine the patient's CHD Risk.        ATP III CLASSIFICATION (  LDL):  <100     mg/dL   Optimal  100-129  mg/dL   Near or Above                    Optimal  130-159  mg/dL   Borderline  160-189  mg/dL   High  >190     mg/dL   Very High Performed at [facility] [address], [place], [place] [address]   TSH     Status: None   Collection Time: 03/15/19  6:42 AM  Result Value Ref Range   TSH 2.607 0.350 - 4.500 uIU/mL    Comment: Performed by a 3rd Generation assay with a functional sensitivity of <=0.01 uIU/mL. Performed at [facility], [place] [address] [place], [place] [address]     Blood Alcohol level:  Lab Results  Component Value Date   ETH <10 74/25/9563    Metabolic Disorder Labs: Lab Results  Component Value Date   HGBA1C 4.6 (L) 03/15/2019   MPG 85.32 03/15/2019   No results found for: PROLACTIN Lab Results  Component Value Date   CHOL 186 03/15/2019   TRIG 87 03/15/2019   HDL 52 03/15/2019   CHOLHDL 3.6 03/15/2019   VLDL 17 03/15/2019   LDLCALC 117 (H) 03/15/2019    Physical Findings: AIMS:  , ,  ,  ,    CIWA:    COWS:     Musculoskeletal: Strength & Muscle Tone: within normal limits Gait & Station: normal Patient leans: N/A  Psychiatric Specialty Exam: Physical Exam  ROS no chest pain, no shortness of breath, no cough, no fever, no chills  Blood pressure 108/79, pulse 96, temperature (!) 97.5 F (36.4 C), temperature source Oral, resp. rate 16, height '5\' 11"'  (1.803 m), weight 71.7 kg, SpO2 100 %.Body mass index is 22.04 kg/m.  General Appearance: Well Groomed  Eye Contact:  Fair-improves during session  Speech:  Normal Rate  Volume:  Normal  Mood:  Reports some improvement, less depressed  Affect:  Remains constricted, vaguely anxious, improves during  session  Thought Process:  Linear and Descriptions of Associations: Intact  Orientation:  Full (Time, Place, and Person)  Thought Content:  Obsessions.  No psychotic symptoms  Suicidal Thoughts:  No currently denies suicidal or self-injurious ideations, contracts for safety.  No homicidal ideations  Homicidal Thoughts:  No  Memory:  Recent and remote grossly intact  Judgement:  Other:  Improving  Insight:  Improving  Psychomotor Activity:  Normal  Concentration:  Concentration: Good and Attention Span: Good  Recall:  Good  Fund of Knowledge:  Good  Language:  Good  Akathisia:  Negative  Handed:  Right  AIMS (if indicated):     Assets:  Desire for Improvement Resilience  ADL's:  Intact  Cognition:  WNL  Sleep:  Number of Hours: 6.5   Assessment:  28 year old, identifies self as male currently transitioning to male.  Presented due to worsening anxiety, OCD symptoms, depression, neurovegetative symptoms, recent suicidal ideations.  Today patient describes some improvement although remains vaguely depressed, anxious and continues to experience OCD symptoms, particularly related to bathroom/faucets.  Denies suicidal ideations.  Good insight, presents motivated in treatment.  Currently tolerating Zoloft trial well.  We have reviewed side effects.    Treatment Plan Summary: Daily contact with patient to assess and evaluate symptoms and progress in treatment, Medication management, Plan Inpatient treatment and Medications as below Encourage group and milieu participation to work on coping  skills and symptom reduction Continue Ativan 0.5 mg every 6 hours PRN for anxiety as needed. Continue trazodone 50 mg nightly PRN for insomnia as needed. Increase Zoloft to 75 mg daily for depression, anxiety Continue dutasteride/estradiol.  Treatment team working on disposition planning options Jenne Campus, MD 03/15/2019, 10:21 AM

## 2019-03-16 NOTE — Tx Team (Signed)
Interdisciplinary Treatment and Diagnostic Plan Update  03/16/2019 Time of Session:  Quillian QuinceJoshua Stoffel MRN: [id number]  Principal Diagnosis: <principal problem not specified>  Secondary Diagnoses: Active Problems:   Episode of recurrent major depressive disorder (HCC)   Severe recurrent major depression without psychotic features (HCC)   Current Medications:  Current Facility-Administered Medications  Medication Dose Route Frequency Provider Last Rate Last Dose  . acetaminophen (TYLENOL) tablet 650 mg  650 mg Oral Q6H PRN Nira ConnBerry, Jason A, NP      . alum & mag hydroxide-simeth (MAALOX/MYLANTA) 200-200-20 MG/5ML suspension 30 mL  30 mL Oral Q4H PRN Nira ConnBerry, Jason A, NP      . dutasteride (AVODART) capsule 0.5 mg  0.5 mg Oral Daily Cobos, Rockey SituFernando A, MD   0.5 mg at 03/16/19 0840  . [START ON 03/17/2019] estradiol valerate (DELESTROGEN) 20 MG/ML injection 10 mg  10 mg Intramuscular Weekly Cobos, Fernando A, MD      . hydrOXYzine (ATARAX/VISTARIL) tablet 25 mg  25 mg Oral TID PRN Jackelyn PolingBerry, Jason A, NP   25 mg at 03/15/19 2139  . LORazepam (ATIVAN) tablet 0.5 mg  0.5 mg Oral Q6H PRN Cobos, Fernando A, MD      . magnesium hydroxide (MILK OF MAGNESIA) suspension 30 mL  30 mL Oral Daily PRN Nira ConnBerry, Jason A, NP      . sertraline (ZOLOFT) tablet 75 mg  75 mg Oral Daily Cobos, Rockey SituFernando A, MD   75 mg at 03/16/19 0840  . traZODone (DESYREL) tablet 50 mg  50 mg Oral QHS PRN Cobos, Rockey SituFernando A, MD   50 mg at 03/15/19 2139   PTA Medications: Medications Prior to Admission  Medication Sig Dispense Refill Last Dose  . dutasteride (AVODART) 0.5 MG capsule Take 0.5 mg by mouth daily.     Marland Kitchen. ESTRADIOL VALERATE IM Inject 0.5 mLs into the muscle every Saturday. 100mg /495mL Concentration       Patient Stressors: Other: untreated symptoms of depression/OCD  Patient Strengths: Ability for insight Average or above average intelligence Capable of independent living Motivation for treatment/growth Physical Health Supportive  family/friends  Treatment Modalities: Medication Management, Group therapy, Case management,  1 to 1 session with clinician, Psychoeducation, Recreational therapy.   Physician Treatment Plan for Primary Diagnosis: <principal problem not specified> Long Term Goal(s): Improvement in symptoms so as ready for discharge Improvement in symptoms so as ready for discharge   Short Term Goals: Ability to identify changes in lifestyle to reduce recurrence of condition will improve Ability to verbalize feelings will improve Ability to disclose and discuss suicidal ideas Ability to demonstrate self-control will improve Ability to identify and develop effective coping behaviors will improve  Medication Management: Evaluate patient's response, side effects, and tolerance of medication regimen.  Therapeutic Interventions: 1 to 1 sessions, Unit Group sessions and Medication administration.  Evaluation of Outcomes: Progressing  Physician Treatment Plan for Secondary Diagnosis: Active Problems:   Episode of recurrent major depressive disorder (HCC)   Severe recurrent major depression without psychotic features (HCC)  Long Term Goal(s): Improvement in symptoms so as ready for discharge Improvement in symptoms so as ready for discharge   Short Term Goals: Ability to identify changes in lifestyle to reduce recurrence of condition will improve Ability to verbalize feelings will improve Ability to disclose and discuss suicidal ideas Ability to demonstrate self-control will improve Ability to identify and develop effective coping behaviors will improve     Medication Management: Evaluate patient's response, side effects, and tolerance of medication regimen.  Therapeutic  Interventions: 1 to 1 sessions, Unit Group sessions and Medication administration.  Evaluation of Outcomes: Progressing   RN Treatment Plan for Primary Diagnosis: <principal problem not specified> Long Term Goal(s): Knowledge of  disease and therapeutic regimen to maintain health will improve  Short Term Goals: Ability to participate in decision making will improve, Ability to verbalize feelings will improve, Ability to disclose and discuss suicidal ideas and Ability to identify and develop effective coping behaviors will improve  Medication Management: RN will administer medications as ordered by provider, will assess and evaluate patient's response and provide education to patient for prescribed medication. RN will report any adverse and/or side effects to prescribing provider.  Therapeutic Interventions: 1 on 1 counseling sessions, Psychoeducation, Medication administration, Evaluate responses to treatment, Monitor vital signs and CBGs as ordered, Perform/monitor CIWA, COWS, AIMS and Fall Risk screenings as ordered, Perform wound care treatments as ordered.  Evaluation of Outcomes: Progressing   LCSW Treatment Plan for Primary Diagnosis: <principal problem not specified> Long Term Goal(s): Safe transition to appropriate next level of care at discharge, Engage patient in therapeutic group addressing interpersonal concerns.  Short Term Goals: Engage patient in aftercare planning with referrals and resources  Therapeutic Interventions: Assess for all discharge needs, 1 to 1 time with Social worker, Explore available resources and support systems, Assess for adequacy in community support network, Educate family and significant other(s) on suicide prevention, Complete Psychosocial Assessment, Interpersonal group therapy.  Evaluation of Outcomes: Progressing   Progress in Treatment: Attending groups: Yes. Participating in groups: Yes. Taking medication as prescribed: Yes. Toleration medication: Yes. Family/Significant other contact made: No, will contact:  patient declined consent for collateral contacts Patient understands diagnosis: Yes. Discussing patient identified problems/goals with staff: Yes. Medical  problems stabilized or resolved: Yes. Denies suicidal/homicidal ideation: Yes. Issues/concerns per patient self-inventory: No. Other:   New problem(s) identified: None   New Short Term/Long Term Goal(s):  medication stabilization, elimination of SI thoughts, development of comprehensive mental wellness plan.    Patient Goals:    Discharge Plan or Barriers: Patient recently admitted. CSW will continue to follow and assess for appropriate referrals and possible discharge planning.    Reason for Continuation of Hospitalization: Anxiety Depression Medication stabilization  Estimated Length of Stay: 3-5 days   Attendees: Patient: 03/16/2019 12:46 PM  Physician: Dr. Neita Garnet, MD 03/16/2019 12:46 PM  Nursing: Chong Sicilian.D, RN 03/16/2019 12:46 PM  RN Care Manager: 03/16/2019 12:46 PM  Social Worker: Radonna Ricker, [place] 03/16/2019 12:46 PM  Recreational Therapist:  03/16/2019 12:46 PM  Other:  03/16/2019 12:46 PM  Other:  03/16/2019 12:46 PM  Other: 03/16/2019 12:46 PM    Scribe for Treatment Team: Marylee Floras, [place] 03/16/2019 12:46 PM

## 2019-03-16 NOTE — Progress Notes (Signed)
Pt affect blunted, mood anxious, cooperative with peers and staff. Pt reports sleeping well last night with trazodone and vistaril. Pt rated his day a "7" and goal was to work on OCD symptoms. Pt denies SI/HI or hallucinations (a) 15 min checks (r) safety maintained.

## 2019-03-16 NOTE — Progress Notes (Signed)
[place] NOVEL CORONAVIRUS (COVID-19) DAILY CHECK-OFF SYMPTOMS - answer yes or no to each - every day NO YES  Have you had a fever in the past 24 hours?  . Fever (Temp > 37.80C / 100F) X   Have you had any of these symptoms in the past 24 hours? . New Cough .  Sore Throat  .  Shortness of Breath .  Difficulty Breathing .  Unexplained Body Aches   X   Have you had any one of these symptoms in the past 24 hours not related to allergies?   . Runny Nose .  Nasal Congestion .  Sneezing   X   If you have had runny nose, nasal congestion, sneezing in the past 24 hours, has it worsened?  X   EXPOSURES - check yes or no X   Have you traveled outside the state in the past 14 days?  X   Have you been in contact with someone with a confirmed diagnosis of COVID-19 or PUI in the past 14 days without wearing appropriate PPE?  X   Have you been living in the same home as a person with confirmed diagnosis of COVID-19 or a PUI (household contact)?    X   Have you been diagnosed with COVID-19?    X              What to do next: Answered NO to all: Answered YES to anything:   Proceed with unit schedule Follow the [facility] Inpatient Flowsheet.   

## 2019-03-16 NOTE — Progress Notes (Signed)
[facility] MD Progress Note  03/16/2019 4:53 PM Paul [name]  MRN:  [id number] Subjective: Patient reports feeling better today.  Continues to describe obsessive thoughts relating to bathroom/faucets.  Also states spent time today reviewing bedcovers to make sure they did not touch floor.  However, overall reports that has spent less time focusing on obsessions/compulsions today and states "I feel more relaxed", "the medications are making a difference". Denies suicidal ideations.  Denies medication side effects thus far.  Objective: I have discussed case with treatment team and I met with patient.  28 year old, identifies self as male currently transitioning to male.  Presented due to worsening anxiety, OCD symptoms, depression, neurovegetative symptoms, recent suicidal ideations.  Currently presenting with improving mood and range of affect.  Noted to present with improving eye contact and overall brighter affect.  As above, describes persistent but somewhat improved OCD symptoms.  Has been noted to spend time in day room/interacting appropriately with peers.  Pleasant/polite on approach. Thus far tolerating medications well (currently on Zoloft trial)  Principal Problem:  Depression, OCD Diagnosis: Active Problems:   Episode of recurrent major depressive disorder (HCC)   Severe recurrent major depression without psychotic features ([place])  Total Time spent with patient: 20 minutes  Past Psychiatric History:   Past Medical History:  Past Medical History:  Diagnosis Date  . Male-to-male transgender person   . Medical history non-contributory     Past Surgical History:  Procedure Laterality Date  . NO PAST SURGERIES     Family History: History reviewed. No pertinent family history. Family Psychiatric  History:  Social History:  Social History   Substance and Sexual Activity  Alcohol Use Not Currently     Social History   Substance and Sexual Activity  Drug Use Never    Social  History   Socioeconomic History  . Marital status: Single    Spouse name: Not on file  . Number of children: Not on file  . Years of education: Not on file  . Highest education level: Not on file  Occupational History  . Not on file  Social Needs  . Financial resource strain: Not on file  . Food insecurity    Worry: Not on file    Inability: Not on file  . Transportation needs    Medical: Not on file    Non-medical: Not on file  Tobacco Use  . Smoking status: Never Smoker  . Smokeless tobacco: Never Used  Substance and Sexual Activity  . Alcohol use: Not Currently  . Drug use: Never  . Sexual activity: Not Currently  Lifestyle  . Physical activity    Days per week: Not on file    Minutes per session: Not on file  . Stress: Not on file  Relationships  . Social Herbalist on phone: Not on file    Gets together: Not on file    Attends religious service: Not on file    Active member of club or organization: Not on file    Attends meetings of clubs or organizations: Not on file    Relationship status: Not on file  Other Topics Concern  . Not on file  Social History Narrative  . Not on file   Additional Social History:   Sleep: improving   Appetite:  improving   Current Medications: Current Facility-Administered Medications  Medication Dose Route Frequency Provider Last Rate Last Dose  . acetaminophen (TYLENOL) tablet 650 mg  650 mg Oral Q6H PRN Gwenlyn Found,  Rinaldo Ratel, NP      . alum & mag hydroxide-simeth (MAALOX/MYLANTA) 200-200-20 MG/5ML suspension 30 mL  30 mL Oral Q4H PRN Lindon Romp A, NP      . dutasteride (AVODART) capsule 0.5 mg  0.5 mg Oral Daily Analya Louissaint, Myer Peer, MD   0.5 mg at 03/16/19 0840  . [START ON 03/17/2019] estradiol valerate (DELESTROGEN) 20 MG/ML injection 10 mg  10 mg Intramuscular Weekly Raider Valbuena A, MD      . hydrOXYzine (ATARAX/VISTARIL) tablet 25 mg  25 mg Oral TID PRN Rozetta Nunnery, NP   25 mg at 03/15/19 2139  . LORazepam  (ATIVAN) tablet 0.5 mg  0.5 mg Oral Q6H PRN Ciarah Peace A, MD      . magnesium hydroxide (MILK OF MAGNESIA) suspension 30 mL  30 mL Oral Daily PRN Lindon Romp A, NP      . sertraline (ZOLOFT) tablet 75 mg  75 mg Oral Daily Jasmaine Rochel, Myer Peer, MD   75 mg at 03/16/19 0840  . traZODone (DESYREL) tablet 50 mg  50 mg Oral QHS PRN Ova Meegan, Myer Peer, MD   50 mg at 03/15/19 2139    Lab Results:  Results for orders placed or performed during the hospital encounter of 03/14/19 (from the past 48 hour(s))  TSH     Status: None   Collection Time: 03/15/19  6:42 AM  Result Value Ref Range   TSH 2.836 0.350 - 4.500 uIU/mL    Comment: Performed by a 3rd Generation assay with a functional sensitivity of <=0.01 uIU/mL. Performed at [facility], [place] [address], [place], [place] [address]   Hemoglobin A1c     Status: Abnormal   Collection Time: 03/15/19  6:42 AM  Result Value Ref Range   Hgb A1c MFr Bld 4.6 (L) 4.8 - 5.6 %    Comment: (NOTE) Pre diabetes:          5.7%-6.4% Diabetes:              >6.4% Glycemic control for   <7.0% adults with diabetes    Mean Plasma Glucose 85.32 mg/dL    Comment: Performed at [place] [address] [place],  [address]  Lipid panel     Status: Abnormal   Collection Time: 03/15/19  6:42 AM  Result Value Ref Range   Cholesterol 186 0 - 200 mg/dL   Triglycerides 87 <150 mg/dL   HDL 52 >40 mg/dL   Total CHOL/HDL Ratio 3.6 RATIO   VLDL 17 0 - 40 mg/dL   LDL Cholesterol 117 (H) 0 - 99 mg/dL    Comment:        Total Cholesterol/HDL:CHD Risk Coronary Heart Disease Risk Table                     Men   Women  1/2 Average Risk   3.4   3.3  Average Risk       5.0   4.4  2 X Average Risk   9.6   7.1  3 X Average Risk  23.4   11.0        Use the calculated Patient Ratio above and the CHD Risk Table to determine the patient's CHD Risk.        ATP III CLASSIFICATION (LDL):  <100     mg/dL   Optimal  100-129  mg/dL   Near or  Above  Optimal  130-159  mg/dL   Borderline  160-189  mg/dL   High  >190     mg/dL   Very High Performed at [facility] [address] [place], [place] [address]   TSH     Status: None   Collection Time: 03/15/19  6:42 AM  Result Value Ref Range   TSH 2.607 0.350 - 4.500 uIU/mL    Comment: Performed by a 3rd Generation assay with a functional sensitivity of <=0.01 uIU/mL. Performed at [facility], [place] [address] [place], [place] [address]     Blood Alcohol level:  Lab Results  Component Value Date   ETH <10 87/56/4332    Metabolic Disorder Labs: Lab Results  Component Value Date   HGBA1C 4.6 (L) 03/15/2019   MPG 85.32 03/15/2019   No results found for: PROLACTIN Lab Results  Component Value Date   CHOL 186 03/15/2019   TRIG 87 03/15/2019   HDL 52 03/15/2019   CHOLHDL 3.6 03/15/2019   VLDL 17 03/15/2019   LDLCALC 117 (H) 03/15/2019    Physical Findings: AIMS:  , ,  ,  ,    CIWA:    COWS:     Musculoskeletal: Strength & Muscle Tone: within normal limits Gait & Station: normal Patient leans: N/A  Psychiatric Specialty Exam: Physical Exam  ROS no chest pain, no shortness of breath, no cough, no fever, no chills  Blood pressure 114/74, pulse 63, temperature 97.6 F (36.4 C), temperature source Oral, resp. rate 16, height '5\' 11"'  (1.803 m), weight 71.7 kg, SpO2 100 %.Body mass index is 22.04 kg/m.  General Appearance: Well Groomed  Eye Contact:  Good  Speech:  Normal Rate  Volume:  Normal  Mood:  Improving mood  Affect:  More reactive affect, presents less anxious  Thought Process:  Linear and Descriptions of Associations: Intact  Orientation:  Full (Time, Place, and Person)  Thought Content:  Describes some improvement in intensity/frequency of obsessions.  No hallucinations, no delusions  Suicidal Thoughts:  No currently denies suicidal or self-injurious ideations, contracts for safety.  No  homicidal ideations  Homicidal Thoughts:  No  Memory:  Recent and remote grossly intact  Judgement:  Other:  Improving  Insight:  Improving  Psychomotor Activity:  Normal  Concentration:  Concentration: Good and Attention Span: Good  Recall:  Good  Fund of Knowledge:  Good  Language:  Good  Akathisia:  Negative  Handed:  Right  AIMS (if indicated):     Assets:  Desire for Improvement Resilience  ADL's:  Intact  Cognition:  WNL  Sleep:  Number of Hours: 6.75   Assessment:  28 year old, identifies self as male currently transitioning to male.  Presented due to worsening anxiety, OCD symptoms, depression, neurovegetative symptoms, recent suicidal ideations.  Patient is describing gradual improvement.  Today describes decreasing obsessions /compulsions and reports feeling less anxious overall.  He is also presenting with an improved range of affect.  Has been able to interact with peers in dayroom.  Tolerating Zoloft well thus far.    Treatment Plan Summary: Daily contact with patient to assess and evaluate symptoms and progress in treatment, Medication management, Plan Inpatient treatment and Medications as below  [facility] plan reviewed as below today 6/26 Encourage group and milieu participation to work on coping skills and symptom reduction Continue Ativan 0.5 mg every 6 hours PRN for anxiety as needed. Continue Trazodone 50 mg nightly PRN for insomnia as needed. Continue Zoloft  75  mg daily for depression, anxiety Continue dutasteride/estradiol.  Treatment team working on disposition planning options Jenne Campus, MD 03/16/2019, 4:53 PMPatient ID: Paul [name], male   DOB: 1990-09-27, 28 y.o.   MRN: [id number]

## 2019-03-16 NOTE — Progress Notes (Signed)
D Pt is observed OOB UAL on the 400 hall this am. He is pleasnat, talkative and obsessed with an ink mark on his arm. He wears hospital-issue patient scrubs. His hygiene is good and his afect is appropriate.     A HE completed his daily assessment and on this he wrote he deneid SI today and he rated his depression, hopelessness and anxieyt " 1/2/3", respectively. He has spent most of the shift socializing with his peers in the dayroom.     R Safety is in place.

## 2019-03-16 NOTE — Progress Notes (Signed)
[place] NOVEL CORONAVIRUS (COVID-19) DAILY CHECK-OFF SYMPTOMS - answer yes or no to each - every day NO YES  Have you had a fever in the past 24 hours?  . Fever (Temp > 37.80C / 100F) X   Have you had any of these symptoms in the past 24 hours? . New Cough .  Sore Throat  .  Shortness of Breath .  Difficulty Breathing .  Unexplained Body Aches   X   Have you had any one of these symptoms in the past 24 hours not related to allergies?   . Runny Nose .  Nasal Congestion .  Sneezing   X   If you have had runny nose, nasal congestion, sneezing in the past 24 hours, has it worsened?  X   EXPOSURES - check yes or no X   Have you traveled outside the state in the past 14 days?  X   Have you been in contact with someone with a confirmed diagnosis of COVID-19 or PUI in the past 14 days without wearing appropriate PPE?  X   Have you been living in the same home as a person with confirmed diagnosis of COVID-19 or a PUI (household contact)?    X   Have you been diagnosed with COVID-19?    X              What to do next: Answered NO to all: Answered YES to anything:   Proceed with unit schedule Follow the [facility] Inpatient Flowsheet.   

## 2019-03-17 DIAGNOSIS — F332 Major depressive disorder, recurrent severe without psychotic features: Principal | ICD-10-CM

## 2019-03-17 NOTE — Progress Notes (Signed)
[facility] MD Progress Note  03/17/2019 1:46 PM Paul [name]  MRN:  [id number]   Subjective: Patient reports that he feels that the medication is helping him.  He denies any suicidal or homicidal ideations. He states that he is trying to find ways to change his thought process.  He states that he does find himself that washing his hands excessively even when he is taking a shower he feels like he cannot touch his feet without washing his hands or touch his armpits without washing his hands, even though he is only washing his feet or washing his armpits. He does have concerns of telling the rest of his family about his transgender from male to male. Especially his grandparents, he does not feel they will accept him as a male. He reports sleeping better and appetite is improving. Patient is requesting to discharge tomorrow.  Objective: Patient's chart and findings reviewed and discussed with treatment team. Patient is pleasant, calm, and cooperative. The patient has been interacting with peers and staff appropriately. Discussed with Dr. Parke Poisson and patient may discharge tomorrow.  Principal Problem: Severe recurrent major depression without psychotic features ([place]) Diagnosis: Principal Problem:   Severe recurrent major depression without psychotic features ([place]) Active Problems:   Episode of recurrent major depressive disorder ([place])  Total Time spent with patient: 20 minutes  Past Psychiatric History: See H&P  Past Medical History:  Past Medical History:  Diagnosis Date  . Male-to-male transgender person   . Medical history non-contributory     Past Surgical History:  Procedure Laterality Date  . NO PAST SURGERIES     Family History: History reviewed. No pertinent family history. Family Psychiatric  History: See H&P Social History:  Social History   Substance and Sexual Activity  Alcohol Use Not Currently     Social History   Substance and Sexual Activity  Drug Use Never    Social  History   Socioeconomic History  . Marital status: Single    Spouse name: Not on file  . Number of children: Not on file  . Years of education: Not on file  . Highest education level: Not on file  Occupational History  . Not on file  Social Needs  . Financial resource strain: Not on file  . Food insecurity    Worry: Not on file    Inability: Not on file  . Transportation needs    Medical: Not on file    Non-medical: Not on file  Tobacco Use  . Smoking status: Never Smoker  . Smokeless tobacco: Never Used  Substance and Sexual Activity  . Alcohol use: Not Currently  . Drug use: Never  . Sexual activity: Not Currently  Lifestyle  . Physical activity    Days per week: Not on file    Minutes per session: Not on file  . Stress: Not on file  Relationships  . Social Herbalist on phone: Not on file    Gets together: Not on file    Attends religious service: Not on file    Active member of club or organization: Not on file    Attends meetings of clubs or organizations: Not on file    Relationship status: Not on file  Other Topics Concern  . Not on file  Social History Narrative  . Not on file   Additional Social History:  Sleep: Good  Appetite:  Fair  Current Medications: Current Facility-Administered Medications  Medication Dose Route Frequency Provider Last Rate Last Dose  . acetaminophen (TYLENOL) tablet 650 mg  650 mg Oral Q6H PRN Nira ConnBerry, Jason A, NP      . alum & mag hydroxide-simeth (MAALOX/MYLANTA) 200-200-20 MG/5ML suspension 30 mL  30 mL Oral Q4H PRN Nira ConnBerry, Jason A, NP      . dutasteride (AVODART) capsule 0.5 mg  0.5 mg Oral Daily Cobos, Rockey SituFernando A, MD   0.5 mg at 03/17/19 0836  . estradiol valerate (DELESTROGEN) 20 MG/ML injection 10 mg  10 mg Intramuscular Weekly Cobos, Rockey SituFernando A, MD   10 mg at 03/17/19 1324  . hydrOXYzine (ATARAX/VISTARIL) tablet 25 mg  25 mg Oral TID PRN Jackelyn PolingBerry, Jason A, NP   25 mg at 03/16/19  2059  . LORazepam (ATIVAN) tablet 0.5 mg  0.5 mg Oral Q6H PRN Cobos, Fernando A, MD      . magnesium hydroxide (MILK OF MAGNESIA) suspension 30 mL  30 mL Oral Daily PRN Nira ConnBerry, Jason A, NP      . sertraline (ZOLOFT) tablet 75 mg  75 mg Oral Daily Cobos, Rockey SituFernando A, MD   75 mg at 03/17/19 0836  . traZODone (DESYREL) tablet 50 mg  50 mg Oral QHS PRN Cobos, Rockey SituFernando A, MD   50 mg at 03/16/19 2059    Lab Results: No results found for this or any previous visit (from the past 48 hour(s)).  Blood Alcohol level:  Lab Results  Component Value Date   ETH <10 03/13/2019    Metabolic Disorder Labs: Lab Results  Component Value Date   HGBA1C 4.6 (L) 03/15/2019   MPG 85.32 03/15/2019   No results found for: PROLACTIN Lab Results  Component Value Date   CHOL 186 03/15/2019   TRIG 87 03/15/2019   HDL 52 03/15/2019   CHOLHDL 3.6 03/15/2019   VLDL 17 03/15/2019   LDLCALC 117 (H) 03/15/2019    Physical Findings: AIMS:  , ,  ,  ,    CIWA:    COWS:     Musculoskeletal: Strength & Muscle Tone: within normal limits Gait & Station: normal Patient leans: N/A  Psychiatric Specialty Exam: Physical Exam  Nursing note and vitals reviewed. Constitutional: He is oriented to person, place, and time. He appears well-developed and well-nourished.  Cardiovascular: Normal rate.  Respiratory: Effort normal.  Musculoskeletal: Normal range of motion.  Neurological: He is alert and oriented to person, place, and time.  Skin: Skin is warm.    Review of Systems  Constitutional: Negative.   HENT: Negative.   Eyes: Negative.   Respiratory: Negative.   Cardiovascular: Negative.   Gastrointestinal: Negative.   Genitourinary: Negative.   Musculoskeletal: Negative.   Skin: Negative.   Neurological: Negative.   Endo/Heme/Allergies: Negative.   Psychiatric/Behavioral: The patient is nervous/anxious.        Obsessive compulsive behaviors and thoughts    Blood pressure 118/74, pulse 68, temperature  (!) 97.4 F (36.3 C), temperature source Oral, resp. rate 18, height 5\' 11"  (1.803 m), weight 71.7 kg, SpO2 100 %.Body mass index is 22.04 kg/m.  General Appearance: Casual  Eye Contact:  Good  Speech:  Clear and Coherent and Normal Rate  Volume:  Normal  Mood:  Anxious and Euthymic  Affect:  Congruent  Thought Process:  Coherent and Descriptions of Associations: Intact  Orientation:  Full (Time, Place, and Person)  Thought Content:  WDL  Suicidal Thoughts:  No  Homicidal Thoughts:  No  Memory:  Immediate;   Good Recent;   Good Remote;   Good  Judgement:  Fair  Insight:  Good  Psychomotor Activity:  Normal  Concentration:  Concentration: Good and Attention Span: Good  Recall:  Good  Fund of Knowledge:  Good  Language:  Good  Akathisia:  No  Handed:  Right  AIMS (if indicated):     Assets:  Communication Skills Desire for Improvement Financial Resources/Insurance Housing Physical Health Social Support Transportation  ADL's:  Intact  Cognition:  WNL  Sleep:  Number of Hours: 6.75   Problems Addressed: MDD severe recurrent  Treatment Plan Summary: Daily contact with patient to assess and evaluate symptoms and progress in treatment, Medication management and Plan is to:  Continue ativan 0.5 mg PO Q6H PRN for anxiety Continue Zoloft 75 mg PO Daily for MDD Continue Vistaril 25 mg PO TID PRN for anxiety Continue Trazodone 50 mg PO QHS PRN for insomnia Encourage group therapy participation Continue Q15 minute safety checks  Maryfrances Bunnellravis B Karriem Muench, FNP 03/17/2019, 1:46 PM

## 2019-03-17 NOTE — Progress Notes (Signed)
Patient has been observed up in the dayroom interacting with peers. He attended group tonight. He reports having had a good day but feels that he needs to discharge d/t him being concerned that  his insurance will not cover this visit. Writer encouraged him to focus on how he feels and to be sure he is ready to discharge. He is pleasant and humorous talking about his transgender process. Support given and safety maintained on unit with 15 min checks.

## 2019-03-17 NOTE — Progress Notes (Signed)
[place] NOVEL CORONAVIRUS (COVID-19) DAILY CHECK-OFF SYMPTOMS - answer yes or no to each - every day NO YES  Have you had a fever in the past 24 hours?  . Fever (Temp > 37.80C / 100F) X   Have you had any of these symptoms in the past 24 hours? . New Cough .  Sore Throat  .  Shortness of Breath .  Difficulty Breathing .  Unexplained Body Aches   X   Have you had any one of these symptoms in the past 24 hours not related to allergies?   . Runny Nose .  Nasal Congestion .  Sneezing   X   If you have had runny nose, nasal congestion, sneezing in the past 24 hours, has it worsened?  X   EXPOSURES - check yes or no X   Have you traveled outside the state in the past 14 days?  X   Have you been in contact with someone with a confirmed diagnosis of COVID-19 or PUI in the past 14 days without wearing appropriate PPE?  X   Have you been living in the same home as a person with confirmed diagnosis of COVID-19 or a PUI (household contact)?    X   Have you been diagnosed with COVID-19?    X              What to do next: Answered NO to all: Answered YES to anything:   Proceed with unit schedule Follow the [facility] Inpatient Flowsheet.   

## 2019-03-17 NOTE — [facility] Group Notes (Signed)
LCSW Group Therapy Note  03/17/2019   10:00-11:00am   Type of Therapy and Topic:  Group Therapy: Anger Cues and Responses  Participation Level:  Did Not Attend   Description of Group:   In this group, patients learned how to recognize the physical, cognitive, emotional, and behavioral responses they have to anger-provoking situations.  They identified a recent time they became angry and how they reacted.  They analyzed how their reaction was possibly beneficial and how it was possibly unhelpful.  The group discussed a variety of healthier coping skills that could help with such a situation in the future.  Deep breathing was practiced briefly.  Therapeutic Goals: 1. Patients will remember their last incident of anger and how they felt emotionally and physically, what their thoughts were at the time, and how they behaved. 2. Patients will identify how their behavior at that time worked for them, as well as how it worked against them. 3. Patients will explore possible new behaviors to use in future anger situations. 4. Patients will learn that anger itself is normal and cannot be eliminated, and that healthier reactions can assist with resolving conflict rather than worsening situations.  Summary of Patient Progress:  The patient shared that his most recent time of anger was sometime recently with something that happens a lot, so he could not be specific.  Hed said he encounters a lot of negative people in his life who bring him down.  He tries to brush it off, but will sometimes get short with the person or make a face.  He initially did not want to talk about anger, but at the end of group was able to talk about how this impacts him and how he truly has not been dealing with his feelings in a healthy manner, and wants to learn better methods.  Therapeutic Modalities:   Cognitive Behavioral Therapy  Maretta Los

## 2019-03-17 NOTE — Progress Notes (Signed)
Adult Psychoeducational Group Note  Date:  03/17/2019 Time:  10:00 PM  Group Topic/Focus:  Wrap-Up Group:   The focus of this group is to help patients review their daily goal of treatment and discuss progress on daily workbooks.  Participation Level:  Active  Participation Quality:  Appropriate  Affect:  Appropriate  Cognitive:  Alert  Insight: Appropriate  Engagement in Group:  Engaged  Modes of Intervention:  Discussion  Additional Comments:  Pt stated that one strength is wanting to include everyone and try to make sure that no one feels left out. One struggle that the pt has is trying to figure out how to live life outside of the hospital. 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   [place], [place] 03/17/2019, 10:00 PM

## 2019-03-17 NOTE — Progress Notes (Signed)
D Pt is observed OOB UAL on the 400 hall today. He is pleasant. He wears his own clothes, his affect is less dark and depressed today.     A HE completed his daily assessment and on this he wrote he denied SI today and he rated his depression, hopelessness and anxiety " 0/1/2", respectively.     R Safeyt in place.

## 2019-03-17 NOTE — Plan of Care (Signed)
D: Pt alert and oriented on the unit. Pt engaging with RN staff and other pts. Pt denies SI/HI, A/VH. Pt rated his depression a 0, feelings of hopelessness a 1, and anxiety a 2, with 10 being the worst. Pt's goal for the day is "To continue to work on my OCD and try to think positively and be thankful." Pt also participated during unit groups and activities. Pt is pleasant and cooperative. A: Education, support and encouragement provided, q15 minute safety checks remain in effect. Medications administered per MD orders. R: No reactions/side effects to medicine noted. Pt denies any concerns at this time, and verbally contracts for safety. Pt ambulating on the unit with no issues. Pt remains safe on and off the unit.   Problem: Activity: Goal: Interest or engagement in activities will improve Outcome: Progressing   Problem: Coping: Goal: Ability to verbalize frustrations and anger appropriately will improve Outcome: Progressing

## 2019-03-18 MED ORDER — SERTRALINE HCL 25 MG PO TABS: 75.0000 mg | ORAL_TABLET | Freq: Every day | ORAL | 0 refills | Status: DC

## 2019-03-18 MED ORDER — HYDROXYZINE HCL 25 MG PO TABS: 25.0000 mg | ORAL_TABLET | Freq: Three times a day (TID) | ORAL | 0 refills | Status: DC | PRN

## 2019-03-18 MED ORDER — TRAZODONE HCL 50 MG PO TABS: 50.0000 mg | ORAL_TABLET | Freq: Every evening | ORAL | 0 refills | Status: DC | PRN

## 2019-03-18 NOTE — Progress Notes (Signed)
Patient has been observed up in the dayroom watching tv and interacting with peers. He reports having had a good day today. We talk about his OCD tendencies and if they have gotten better. He feels he is about the same. He reports feeling nervous about discharge because he reports this is a controlled environment here and how will he do once back out on his own. He is very pleasant and even spoke of his uncertainty of his transgender process and who will be accepting of his decision. Support given and safety maintained on unit with 15 min checks.

## 2019-03-18 NOTE — BHH Suicide Risk Assessment (Addendum)
[facility] Discharge Suicide Risk Assessment   Principal Problem: Severe recurrent major depression without psychotic features [facility]) Discharge Diagnoses: Principal Problem:   Severe recurrent major depression without psychotic features ([place]) Active Problems:   Episode of recurrent major depressive disorder ([place])   Total Time spent with patient: 30 minutes  Musculoskeletal: Strength & Muscle Tone: within normal limits Gait & Station: normal Patient leans: N/A  Psychiatric Specialty Exam: ROS currently denies headache, no chest pain, no cough,  no shortness of breath, no vomiting, no fever  Blood pressure 118/74, pulse 68, temperature (!) 97.4 F (36.3 C), temperature source Oral, resp. rate 18, height 5\' 11"  (1.803 m), weight 71.7 kg, SpO2 100 %.Body mass index is 22.04 kg/m.  General Appearance: Well Groomed  Eye Contact::  Good  Speech:  Normal Rate409  Volume:  Normal  Mood:  improved , states feeling better  Affect:  Appropriate and more reactive  Thought Process:  Linear and Descriptions of Associations: Intact  Orientation:  Full (Time, Place, and Person)  Thought Content:  no hallucinations, no delusions , not internally preoccupied   Suicidal Thoughts:  No denies suicidal or self injurious ideations, no homicidal or violent ideations   Homicidal Thoughts:  No  Memory:  recent and remote grossly intact   Judgement:  Other:  improving   Insight:  improving   Psychomotor Activity:  Normal  Concentration:  Good  Recall:  Good  Fund of Knowledge:Good  Language: Good  Akathisia:  Negative  Handed:  Right  AIMS (if indicated):     Assets:  Desire for Improvement Resilience  Sleep:  Number of Hours: 6.75  Cognition: WNL  ADL's:  Intact   Mental Status Per Nursing Assessment::   On Admission:  Self-harm thoughts  Demographic Factors:  28 year old male , single, currently living with friends  Loss Factors: Some family stressors  Historical Factors: Reports history of  OCD and Depression. No prior psychiatric admissions, no prior admissions  Risk Reduction Factors:   Sense of responsibility to family, Living with another person, especially a relative and Positive coping skills or problem solving skills  Continued Clinical Symptoms:  At this time patient is alert, attentive, calm, well groomed, has good eye contact, speech normal, mood is improved and reports feeling better. No thought disorder. Denies suicidal or self injurious ideations, denies homicidal or violent ideations. Describes persistent but somewhat decreased obsessions, mainly related to issues pertaining faucets/light switches which are checked and rechecked at times . Currently future oriented .  Denies medication side effects. Has tolerated Zoloft well . Side effects discussed .  Behavior on unit calm and in good control. Pleasant on approach. Patient has mildly elevated LFTs without any associated symptoms, denies risk factors for viral hepatitis. Patient thinks this may have been related to spironolactone management, which had been stopped recently.    Cognitive Features That Contribute To Risk:  No gross cognitive deficits noted upon discharge. Is alert , attentive, and oriented x 3    Suicide Risk:  Mild:  Suicidal ideation of limited frequency, intensity, duration, and specificity.  There are no identifiable plans, no associated intent, mild dysphoria and related symptoms, good self-control (both objective and subjective assessment), few other risk factors, and identifiable protective factors, including available and accessible social support.  Follow-up Information    [facility] [place] Follow up on 03/21/2019.   Why: Follow up with Joaquim Lai is Wednesday, 7/1 at  3:00p.  Please bring your current medications and discharge  paperwork from this hospitalization.  Contact information: [address] [place] [address] [phone]           Plan Of Care/Follow-up  recommendations:  Activity:  as tolerated Diet:  regular Tests:  NA Other:  See below  Patient is requesting discharge and there are no current grounds for involuntary commitment. Leaving unit in good spirits . Plans to return home. Follow up as above.  Has an outpatient MD , Dr. Bascom LevelsFrazier, for hormonal management. Will follow up with her for ongoing monitoring of LFTs, as needed.    Craige CottaFernando A , MD 03/18/2019, 9:36 AM

## 2019-03-18 NOTE — Progress Notes (Signed)
  Pt denied SI/HI. Pt reported decreased anxiety and OCD. Pt verbalized readiness for discharge today.   Pt received both written and verbal discharge instructions by Sandre Kitty, RN.   Pt safety discharged to the lobby by Dorena Bodo., RN.

## 2019-03-18 NOTE — Plan of Care (Signed)
Problem: Education: Goal: Knowledge of [facility] Education information/materials will improve Outcome: Adequate for Discharge Goal: Emotional status will improve Outcome: Adequate for Discharge Goal: Mental status will improve Outcome: Adequate for Discharge Goal: Verbalization of understanding the information provided will improve Outcome: Adequate for Discharge   Problem: Activity: Goal: Interest or engagement in activities will improve Outcome: Adequate for Discharge Goal: Sleeping patterns will improve Outcome: Adequate for Discharge   Problem: Coping: Goal: Ability to verbalize frustrations and anger appropriately will improve Outcome: Adequate for Discharge Goal: Ability to demonstrate self-control will improve Outcome: Adequate for Discharge   Problem: Health Behavior/Discharge Planning: Goal: Identification of resources available to assist in meeting health care needs will improve Outcome: Adequate for Discharge Goal: Compliance with treatment plan for underlying cause of condition will improve Outcome: Adequate for Discharge   Problem: Physical Regulation: Goal: Ability to maintain clinical measurements within normal limits will improve Outcome: Adequate for Discharge   Problem: Safety: Goal: Periods of time without injury will increase Outcome: Adequate for Discharge   Problem: Activity: Goal: Interest or engagement in leisure activities will improve Outcome: Adequate for Discharge Goal: Imbalance in normal sleep/wake cycle will improve Outcome: Adequate for Discharge   Problem: Safety: Goal: Ability to disclose and discuss suicidal ideas will improve Outcome: Adequate for Discharge Goal: Ability to identify and utilize support systems that promote safety will improve Outcome: Adequate for Discharge   Problem: Coping: Goal: Ability to identify and develop effective coping behavior will improve Outcome: Adequate for Discharge Goal: Ability to  interact with others will improve Outcome: Adequate for Discharge Goal: Demonstration of participation in decision-making regarding own care will improve Outcome: Adequate for Discharge Goal: Ability to use eye contact when communicating with others will improve Outcome: Adequate for Discharge   Problem: Education: Goal: Utilization of techniques to improve thought processes will improve Outcome: Adequate for Discharge Goal: Knowledge of the prescribed therapeutic regimen will improve Outcome: Adequate for Discharge   Problem: Activity: Goal: Interest or engagement in leisure activities will improve Outcome: Adequate for Discharge Goal: Imbalance in normal sleep/wake cycle will improve Outcome: Adequate for Discharge   Problem: Coping: Goal: Coping ability will improve Outcome: Adequate for Discharge Goal: Will verbalize feelings Outcome: Adequate for Discharge   Problem: Health Behavior/Discharge Planning: Goal: Ability to make decisions will improve Outcome: Adequate for Discharge Goal: Compliance with therapeutic regimen will improve Outcome: Adequate for Discharge   Problem: Role Relationship: Goal: Will demonstrate positive changes in social behaviors and relationships Outcome: Adequate for Discharge   Problem: Safety: Goal: Ability to disclose and discuss suicidal ideas will improve Outcome: Adequate for Discharge Goal: Ability to identify and utilize support systems that promote safety will improve Outcome: Adequate for Discharge   Problem: Self-Concept: Goal: Will verbalize positive feelings about self Outcome: Adequate for Discharge Goal: Level of anxiety will decrease Outcome: Adequate for Discharge   Problem: Education: Goal: Ability to state activities that reduce stress will improve Outcome: Adequate for Discharge   Problem: Coping: Goal: Ability to identify and develop effective coping behavior will improve Outcome: Adequate for Discharge    Problem: Self-Concept: Goal: Ability to identify factors that promote anxiety will improve Outcome: Adequate for Discharge Goal: Level of anxiety will decrease Outcome: Adequate for Discharge Goal: Ability to modify response to factors that promote anxiety will improve Outcome: Adequate for Discharge   Problem: Education: Goal: Ability to make informed decisions regarding treatment will improve Outcome: Adequate for Discharge   Problem: Coping: Goal: Coping ability will  improve Outcome: Adequate for Discharge   Problem: Health Behavior/Discharge Planning: Goal: Identification of resources available to assist in meeting health care needs will improve Outcome: Adequate for Discharge   Problem: Medication: Goal: Compliance with prescribed medication regimen will improve Outcome: Adequate for Discharge   Problem: Self-Concept: Goal: Ability to disclose and discuss suicidal ideas will improve Outcome: Adequate for Discharge Goal: Will verbalize positive feelings about self Outcome: Adequate for Discharge

## 2019-03-18 NOTE — Discharge Summary (Signed)
Physician Discharge Summary Note  Patient:  Paul [name] is an 28 y.o., male MRN:  [id number] DOB:  01/02/1991 Patient phone:  [phone] (home)  Patient address:   [address] [place] [address],  Total Time spent with patient: 20 minutes  Date of Admission:  03/14/2019 Date of Discharge: 03/18/19  Reason for Admission:  Increased OCD symptoms with SI  Principal Problem: Severe recurrent major depression without psychotic features [facility](HCC) Discharge Diagnoses: Principal Problem:   Severe recurrent major depression without psychotic features (HCC) Active Problems:   Episode of recurrent major depressive disorder (HCC)   Past Psychiatric History: Long history of OCD and depression. Previously treated with Prozac but discontinued due to feeling emotionally numb. Paxil was discontinued due to side effects. History of punching himself. Denies prior hospitalizations or suicide attempts. Denies history of manic or psychotic symptoms.  Past Medical History:  Past Medical History:  Diagnosis Date  . Male-to-male transgender person   . Medical history non-contributory     Past Surgical History:  Procedure Laterality Date  . NO PAST SURGERIES     Family History: History reviewed. No pertinent family history. Family Psychiatric  History: He is adopted. He thinks his biological mother might have had problems with alcohol Social History:  Social History   Substance and Sexual Activity  Alcohol Use Not Currently     Social History   Substance and Sexual Activity  Drug Use Never    Social History   Socioeconomic History  . Marital status: Single    Spouse name: Not on file  . Number of children: Not on file  . Years of education: Not on file  . Highest education level: Not on file  Occupational History  . Not on file  Social Needs  . Financial resource strain: Not on file  . Food insecurity    Worry: Not on file    Inability: Not on file  . Transportation needs     Medical: Not on file    Non-medical: Not on file  Tobacco Use  . Smoking status: Never Smoker  . Smokeless tobacco: Never Used  Substance and Sexual Activity  . Alcohol use: Not Currently  . Drug use: Never  . Sexual activity: Not Currently  Lifestyle  . Physical activity    Days per week: Not on file    Minutes per session: Not on file  . Stress: Not on file  Relationships  . Social Musicianconnections    Talks on phone: Not on file    Gets together: Not on file    Attends religious service: Not on file    Active member of club or organization: Not on file    Attends meetings of clubs or organizations: Not on file    Relationship status: Not on file  Other Topics Concern  . Not on file  Social History Narrative  . Not on file    Hospital Course:   [facility] Assessment: [age] year old with history of MDD, OCD. He is a male transitioning to male on hormone therapy but still prefers he/him pronouns and to be called Paul [name] at this time. He reports increased OCD symptoms over the last several weeks, including frequent handwashing, checking doors/locks/faucets/lights, adjusting blinds in windows, cleaning feet. He reports obsessions and compulsive behaviors are "taking over my life," causing stress, taking up large amounts of time, and have brought him to tears. 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 He reports anxiety has also increased related to being at home alone more with the pandemic. He is transitioning to male with hormone therapy and reports depression related to his mother's difficulty accepting transgender status, as well as people at his old church telling him that being transgender is a sin. He wants to be male but is unsure if he believes this is wrong based on religious teachings. 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 He  states he has taken Xanax about three times in the last two weeks, with last dose two days ago. He reports suicidal ideation with thoughts of obtaining a large dose of heroin to end his life, but denies history of heroin use. Denies other drug/alcohol use. UDS positive for BZDs only. AST, ALT elevated (91, 93). He denies known history of liver dysfunction. Denies history of IV drug use. He is taking estrogen and Adovert. He was taking spironolactone but stopped two weeks ago related to insomnia. He reports insomnia has improved since that time but still has difficulty sleeping.  [facility] The patient stabilized on medication and therapy. Patient was discharged on Zoloft 75 mg Daily, Vistaril 25 mg TID PRN, and trazodone 50 mg QhS PRN. He was also continued on Delestrogen and Adovart for transgenden use. Patient has shown improvement with improved mood, affect, sleep, appetite, and interaction. Patient has attended group and participated. Patient has been seen in the day room interacting with peers and staff appropriately. Patient denies any SI/HI/AVH and contracts for safety. Patient agrees to follow up at [facility]. Patient is provided with prescriptions for their medications upon discharge.  Physical Findings: AIMS:  , ,  ,  ,    CIWA:    COWS:     Musculoskeletal: Strength & Muscle Tone: within normal limits Gait & Station: normal Patient leans: N/A  Psychiatric Specialty Exam: See MD SRA Assessment Physical Exam  ROS  Blood pressure 118/74, pulse 68, temperature (!) 97.4 F (36.3 C), temperature source Oral, resp. rate 18, height 5\' 11"  (1.803 m), weight 71.7 kg, SpO2 100 %.Body mass index is 22.04 kg/m.  Sleep:  Number of Hours: 6.75     Have you used any form of tobacco in the last 30 days? (Cigarettes, Smokeless Tobacco, Cigars, and/or Pipes): No  Has this patient used any form of tobacco in the last 30 days? (Cigarettes, Smokeless Tobacco,  Cigars, and/or Pipes) Yes, No  Blood Alcohol level:  Lab Results  Component Value Date   ETH <10 03/13/2019    Metabolic Disorder Labs:  Lab Results  Component Value Date   HGBA1C 4.6 (L) 03/15/2019   MPG 85.32 03/15/2019   No results found for: PROLACTIN Lab Results  Component Value Date   CHOL 186 03/15/2019   TRIG 87 03/15/2019   HDL 52 03/15/2019   CHOLHDL 3.6 03/15/2019   VLDL 17 03/15/2019   LDLCALC 117 (H) 03/15/2019    See Psychiatric Specialty Exam and Suicide Risk Assessment completed by Attending Physician prior to discharge.  Discharge destination:  Home  Is patient on multiple antipsychotic therapies at discharge:  No   Has Patient had three or more failed trials of antipsychotic monotherapy by history:  No  Recommended Plan for Multiple Antipsychotic Therapies: NA   Allergies as of 03/18/2019   No Known Allergies     Medication  List    TAKE these medications     Indication  dutasteride 0.5 MG capsule Commonly known as: AVODART Take 0.5 mg by mouth daily.  Indication: Per PCP   ESTRADIOL VALERATE IM Inject 0.5 mLs into the muscle every Saturday. 100mg /37mL Concentration  Indication: Per PCP   hydrOXYzine 25 MG tablet Commonly known as: ATARAX/VISTARIL Take 1 tablet (25 mg total) by mouth 3 (three) times daily as needed for anxiety.  Indication: Feeling Anxious   sertraline 25 MG tablet Commonly known as: ZOLOFT Take 3 tablets (75 mg total) by mouth daily. Start taking on: March 19, 2019  Indication: Major Depressive Disorder   traZODone 50 MG tablet Commonly known as: DESYREL Take 1 tablet (50 mg total) by mouth at bedtime as needed for sleep.  Indication: [place] [facility] Follow up on 03/21/2019.   Why: Follow up with Joaquim Lai is Wednesday, 7/1 at  3:00p.  Please bring your current medications and discharge paperwork from this hospitalization.  Contact information: [address] [place] [address] [phone]           Follow-up recommendations:  Continue activity as tolerated. Continue diet as recommended by your PCP. Ensure to keep all appointments with outpatient providers.  Comments:  Patient is instructed prior to discharge to: Take all medications as prescribed by his/her mental healthcare provider. Report any adverse effects and or reactions from the medicines to his/her outpatient provider promptly. Patient has been instructed & cautioned: To not engage in alcohol and or illegal drug use while on prescription medicines. In the event of worsening symptoms, patient is instructed to call the crisis hotline, 911 and or go to the nearest ED for appropriate evaluation and treatment of symptoms. To follow-up with his/her primary care provider for your other medical issues, concerns and or health care needs.    Signed: [place], FNP 03/18/2019, 10:59 AM

## 2019-03-18 NOTE — Progress Notes (Signed)
Ptcompleted his daily assessment and on this he wrote he denied having SI today and he rated his depression, hopelessness and anxeity " 0/0/2", respectively. He is given his dc instructions by this Probation officer. They are discussed with him and he stated verbal understanding and willingness to comply. He is given all belonings in his locker and cc of instrucitons are given to him ( SRA, AVS, SSP and transition record). He is escorted to bldg entrance and dc'd to hoem amb petr MD order.

## 2019-03-18 NOTE — Progress Notes (Signed)
  [facility] Adult Case Management Discharge Plan :  Will you be returning to the same living situation after discharge:  Yes,  roommates At discharge, do you have transportation home?: Yes,  arranged by patient Do you have the ability to pay for your medications: No.  No income, no insurance, referred to [facility] for assistance  Release of information consent forms completed and turned in to Medical Records by CSW.   Patient to Follow up at: Follow-up Information    [facility] [place] Follow up on 03/21/2019.   Why: Follow up with Joaquim Lai is Wednesday, 7/1 at  3:00p.  Please bring your current medications and discharge paperwork from this hospitalization.  Contact information: [address] [place] [address] [phone]           Next level of care provider has access to [facility] and Suicide Prevention discussed: No.  Declined, so done only with patient  Have you used any form of tobacco in the last 30 days? (Cigarettes, Smokeless Tobacco, Cigars, and/or Pipes): No  Has patient been referred to the Quitline?: N/A patient is not a smoker  Patient has been referred for addiction treatment: N/A  Maretta Los, LCSW 03/18/2019, 1:27 PM

## 2019-03-18 NOTE — BHH Group Notes (Signed)
[facility] LCSW Group Therapy Note  03/18/2019  10:00-11:00AM  Type of Therapy and Topic:  Group Therapy:  Adding Supports Including Yourself  Participation Level:  Active   Description of Group:  Patients in this group were introduced to the concept that additional supports including self-support are an essential part of recovery.  Patients listed what supports they believe they need to add to their lives to achieve their goals at discharge, and they listed such things as therapist, family, doctor, support groups, and service dog.  CSW described the  continuum of mental health/substance abuse services available and the group discussed the differences among these including support group, therapy group, 12-step group, Actuary, Transport planner, and such.  A song entitled "My Own Hero" was played and a group discussion ensued in which patients stated they could relate to the song and it inspired them to realize they have be willing to help themselves in order to succeed, because other people cannot achieve sobriety or stability for them.  A song was played called "I Am Enough" which led to a discussion about being willing to believe we are worth the effort of being a self-support.  Group members expressed appreciation for each other.  Therapeutic Goals: 1)  demonstrate the importance of being a key part of one's own support system 2)  discuss various available supports 3)  encourage patient to use music as part of their self-support and focus on goals 4)  elicit ideas from patients about supports that need to be added   Summary of Patient Progress:  The patient expressed healthy supports include his roommate and best friend, while his unhealthy support is himself.  Therapeutic Modalities:   Motivational Interviewing Activity  Maretta Los

## 2019-10-06 ENCOUNTER — Inpatient Hospital Stay (HOSPITAL_COMMUNITY)
Admission: RE | Admit: 2019-10-06 | Discharge: 2019-10-13 | DRG: 885 | Disposition: A | Discharge status: Home / Self Care | Payer: 59 | Attending: Psychiatry | Admitting: Psychiatry

## 2019-10-06 DIAGNOSIS — R45851 Suicidal ideations: Secondary | ICD-10-CM | POA: Diagnosis present

## 2019-10-06 DIAGNOSIS — F649 Gender identity disorder, unspecified: Secondary | ICD-10-CM | POA: Diagnosis present

## 2019-10-06 DIAGNOSIS — F332 Major depressive disorder, recurrent severe without psychotic features: Principal | ICD-10-CM | POA: Diagnosis present

## 2019-10-06 DIAGNOSIS — Z915 Personal history of self-harm: Secondary | ICD-10-CM

## 2019-10-06 DIAGNOSIS — F429 Obsessive-compulsive disorder, unspecified: Secondary | ICD-10-CM | POA: Diagnosis present

## 2019-10-06 DIAGNOSIS — F422 Mixed obsessional thoughts and acts: Secondary | ICD-10-CM | POA: Diagnosis not present

## 2019-10-06 DIAGNOSIS — Z20822 Contact with and (suspected) exposure to covid-19: Secondary | ICD-10-CM | POA: Diagnosis present

## 2019-10-06 LAB — RESPIRATORY PANEL BY RT PCR (FLU A&B, COVID)
Influenza A by PCR: NEGATIVE
Influenza B by PCR: NEGATIVE
SARS Coronavirus 2 by RT PCR: NEGATIVE

## 2019-10-06 MED ORDER — TRAZODONE HCL 50 MG PO TABS
50.0000 mg | ORAL_TABLET | Freq: Every evening | ORAL | Status: DC | PRN
  Filled 2019-10-06: qty 1

## 2019-10-06 MED ORDER — ACETAMINOPHEN 325 MG PO TABS: 650.0000 mg | ORAL_TABLET | Freq: Four times a day (QID) | ORAL | Status: DC | PRN

## 2019-10-06 MED ORDER — ALUM & MAG HYDROXIDE-SIMETH 200-200-20 MG/5ML PO SUSP: 30.0000 mL | ORAL | Status: DC | PRN

## 2019-10-06 MED ORDER — HYDROXYZINE HCL 25 MG PO TABS
25.0000 mg | ORAL_TABLET | Freq: Three times a day (TID) | ORAL | Status: DC | PRN
  Filled 2019-10-06 (×2): qty 1

## 2019-10-06 MED ORDER — MAGNESIUM HYDROXIDE 400 MG/5ML PO SUSP: 30.0000 mL | Freq: Every day | ORAL | Status: DC | PRN

## 2019-10-06 MED ORDER — DUTASTERIDE 0.5 MG PO CAPS
0.5000 mg | ORAL_CAPSULE | Freq: Every day | ORAL | Status: DC
  Administered 2019-10-11 – 2019-10-13 (×2): 0.5 mg via ORAL
  Filled 2019-10-06 (×9): qty 1

## 2019-10-06 NOTE — BH Assessment (Signed)
Assessment Note  Paul Paul [name] is an 29 y.o. single male who is transitioning to male (preferred name Paul Paul [name], he/him pronouns). Pt has a diagnosis of Major Depressive Disorder and OCD and reports feeling severely depressed for the past few weeks. He states his OCD symptoms have been "taking over my life." Pt reports being "hyper-focused" on things and has spent hours cleaning, picking up tiny sticks in the yard, cleaning his shoes and washing his hands. He says he stayed awake all last night repeatedly cleaning dishes. He says he has been preoccupied with "sins" and past actions that he regrets. He says his depression and anxiety are so severe that he doesn't want to live anymore. Pt says he has thought of ways to kill himself but doesn't have a specific plan. In June 2020 he obtained a large dose of heroin with intent to kill himself. He denies any previous suicide attempts. Pt acknowledges symptoms including crying spells, social withdrawal, loss of interest in usual pleasures, fatigue, irritability, decreased concentration, decreased sleep, decreased appetite and feelings of guilt, worthlessness and hopelessness. He says he has not been caring for his hygiene and grooming. Pt denies any history of intentional self-injurious behaviors. Pt denies Paul [name] homicidal ideation or history of violence. Pt denies any history of auditory or visual hallucinations. Pt reports infrequently using marijuana and denies other substance use.  Pt identifies several stressors. He says he stopped working at a [employer] because he was fixated on minute details. He says he planned to move to [place] but that plan fell through. Pt reports he is living with his mother and her dogs and the dogs are stressful because they bring dirt into the house. He is currently on hormone therapy and recently started Celexa. He denies legal problems. He denies access to firearms. Pt reports no Paul [name] outpatient psychiatrist. He says he has  used online therapy service. He was inpatient at [facility] in June 2020.  Pt is casually dressed, alert and oriented x4. Pt speaks in a soft tone, at moderate volume and normal pace. Motor behavior appears normal. Eye contact is good. Pt's mood is depressed and anxious, affect is congruent with mood. Thought process is coherent and relevant. There is no indication Pt is currently responding to internal stimuli or experiencing delusional thought content. Pt was pleasant and cooperative throughout assessment.  Diagnosis:  F33.2 Major depressive disorder, Recurrent episode, Severe F42 Obsessive-compulsive disorder  Past Medical History:  Past Medical History:  Diagnosis Date  . Male-to-male transgender person   . Medical history non-contributory     Past Surgical History:  Procedure Laterality Date  . NO PAST SURGERIES      Family History: No family history on file.  Social History:  reports that he has never smoked. He has never used smokeless tobacco. He reports previous alcohol use. He reports that he does not use drugs.  Additional Social History:  Alcohol / Drug Use Pain Medications: Denies abuse Prescriptions: Denies abuse Over the Counter: Denies abuse History of alcohol / drug use?: Yes(Pt reports history of occasional marijuana use.) Longest period of sobriety (when/how long): unknown  CIWA:   COWS:    Allergies: No Known Allergies  Home Medications:  Medications Prior to Admission  Medication Sig Dispense Refill  . dutasteride (AVODART) 0.5 MG capsule Take 0.5 mg by mouth daily.    Marland Kitchen ESTRADIOL VALERATE IM Inject 0.5 mLs into the muscle every Saturday. 100mg /74mL Concentration    . hydrOXYzine (ATARAX/VISTARIL) 25 MG tablet Take  1 tablet (25 mg total) by mouth 3 (three) times daily as needed for anxiety. 30 tablet 0  . sertraline (ZOLOFT) 25 MG tablet Take 3 tablets (75 mg total) by mouth daily. 90 tablet 0  . traZODone (DESYREL) 50 MG tablet Take 1 tablet (50 mg  total) by mouth at bedtime as needed for sleep. 30 tablet 0    OB/GYN Status:  No LMP for male patient.  General Assessment Data Location of Assessment: [facility] TTS Assessment: In system Is this a Tele or Face-to-Face Assessment?: Face-to-Face Is this an Initial Assessment or a Re-assessment for this encounter?: Initial Assessment Patient Accompanied by:: N/A Language Other than English: No Living Arrangements: Other (Comment)(Lives with mother) What gender do you identify as?: Male Marital status: Single Maiden name: NA Pregnancy Status: No Living Arrangements: Parent Can pt return to Paul [name] living arrangement?: Yes Admission Status: Voluntary Is patient capable of signing voluntary admission?: Yes Referral Source: Self/Family/Friend Insurance type: [employer] Screening Exam ([place]) Medical Exam completed: Paul Current, NP)  Crisis Care Plan Living Arrangements: Parent Legal Guardian: Other:(Self) Name of Psychiatrist: None Name of Therapist: None  Education Status Is patient currently in school?: No Is the patient employed, unemployed or receiving disability?: Unemployed  Risk to self with the past 6 months Suicidal Ideation: Yes-Currently Present Has patient been a risk to self within the past 6 months prior to admission? : Yes Suicidal Intent: No Has patient had any suicidal intent within the past 6 months prior to admission? : No Is patient at risk for suicide?: Yes Suicidal Plan?: No Has patient had any suicidal plan within the past 6 months prior to admission? : Yes Access to Means: No What has been your use of drugs/alcohol within the last 12 months?: Pt reports infrwquent marijuana use Previous Attempts/Gestures: No How many times?: 0 Other Self Harm Risks: None Triggers for Past Attempts: None known Intentional Self Injurious Behavior: None Family Suicide History: Unknown(Pt adopted) Recent stressful life  event(s): Financial Problems, Job Loss, Other (Comment)(Transitioning) Persecutory voices/beliefs?: No Depression: Yes Depression Symptoms: Despondent, Insomnia, Tearfulness, Isolating, Fatigue, Guilt, Loss of interest in usual pleasures, Feeling worthless/self pity Substance abuse history and/or treatment for substance abuse?: No Suicide prevention information given to non-admitted patients: Not applicable  Risk to Others within the past 6 months Homicidal Ideation: No Does patient have any lifetime risk of violence toward others beyond the six months prior to admission? : No Thoughts of Harm to Others: No Paul [name] Homicidal Intent: No Paul [name] Homicidal Plan: No Access to Homicidal Means: No Identified Victim: None History of harm to others?: No Assessment of Violence: None Noted Violent Behavior Description: Pt denies history of violence Does patient have access to weapons?: No Criminal Charges Pending?: No Does patient have a court date: No Is patient on probation?: No  Psychosis Hallucinations: None noted Delusions: None noted  Mental Status Report Appearance/Hygiene: Other (Comment)(Casually dressed) Eye Contact: Good Motor Activity: Unremarkable, Freedom of movement Speech: Logical/coherent, Soft Level of Consciousness: Alert Mood: Depressed, Anxious Affect: Depressed, Anxious Anxiety Level: Severe Thought Processes: Coherent, Relevant Judgement: Partial Orientation: Person, Place, Time, Situation Obsessive Compulsive Thoughts/Behaviors: Severe  Cognitive Functioning Concentration: Normal Memory: Recent Intact, Remote Intact Is patient IDD: No Insight: Fair Impulse Control: Fair Appetite: Poor Have you had any weight changes? : Loss Amount of the weight change? (lbs): 10 lbs Sleep: Decreased Total Hours of Sleep: 3 Vegetative Symptoms: Decreased grooming  ADLScreening [facility] Assessment Services) Patient's cognitive ability adequate  to safely complete daily  activities?: Yes Patient able to express need for assistance with ADLs?: Yes Independently performs ADLs?: Yes (appropriate for developmental age)  Prior Inpatient Therapy Prior Inpatient Therapy: Yes Prior Therapy Dates: 02/2019 Prior Therapy Facilty/Provider(s): [facility] Reason for Treatment: Depression, OCD, SI  Prior Outpatient Therapy Prior Outpatient Therapy: Yes Prior Therapy Dates: 2020 Prior Therapy Facilty/Provider(s): Online therapy Reason for Treatment: Depression, OCD Does patient have an ACCT team?: No Does patient have Intensive In-House Services?  : No Does patient have [facility] services? : No Does patient have P4CC services?: No  ADL Screening (condition at time of admission) Patient's cognitive ability adequate to safely complete daily activities?: Yes Is the patient deaf or have difficulty hearing?: No Does the patient have difficulty seeing, even when wearing glasses/contacts?: No Does the patient have difficulty concentrating, remembering, or making decisions?: No Patient able to express need for assistance with ADLs?: Yes Does the patient have difficulty dressing or bathing?: No Independently performs ADLs?: Yes (appropriate for developmental age) Does the patient have difficulty walking or climbing stairs?: No Weakness of Legs: None Weakness of Arms/Hands: None       Abuse/Neglect Assessment (Assessment to be complete while patient is alone) Abuse/Neglect Assessment Can Be Completed: Yes Physical Abuse: Denies Verbal Abuse: Denies Sexual Abuse: Denies Exploitation of patient/patient's resources: Denies Self-Neglect: Denies     Merchant navy officer (For Healthcare) Does Patient Have a Medical Advance Directive?: No Would patient like information on creating a medical advance directive?: No - Patient declined          Disposition: Hillery Jacks, NP completed MSE and determined Pt meets criteria for inpatient psychiatric treatment. Pt accepted to  the service of Dr. Jola Babinski, room 305-1.  Disposition Initial Assessment Completed for this Encounter: Yes Disposition of Patient: Admit Type of inpatient treatment program: Adult Patient refused recommended treatment: No  On Site Evaluation by:  Hillery Jacks, NP Reviewed with Physician:    Pamalee Leyden, [facility] Triage Specialist [phone]  Patsy Baltimore, Harlin Rain 10/06/2019 7:57 PM

## 2019-10-06 NOTE — H&P (Signed)
Behavioral Health Medical Screening Exam  Paul [name] is an 29 y.o. male.  Presents to behavioral health as a walk-in.  Patient reports worsening depression and suicidal ideation.  States hopelessness and helplessness.  Reports he was recently initiated on citalopram which he does not feel is helping his symptoms.  Unsure family history of mental illness as he reports he was adopted.  Patient reports he was followed by a therapist in the past.  However states his primary care provider has been providing medication refills for citalopram.  Patient to be admitted.  Support, encouragement and reassurance was provided.   Total Time spent with patient: 15 minutes  Psychiatric Specialty Exam: Physical Exam  Review of Systems  There were no vitals taken for this visit.There is no height or weight on file to calculate BMI.  General Appearance: Casual  Eye Contact:  Good  Speech:  Clear and Coherent  Volume:  Normal  Mood:  Anxious and Depressed  Affect:  Congruent  Thought Process:  Coherent  Orientation:  Full (Time, Place, and Person)  Thought Content:  Logical  Suicidal Thoughts:  Yes.  with intent/plan  Homicidal Thoughts:  No  Memory:  Immediate;   Fair Recent;   Fair  Judgement:  Fair  Insight:  Fair  Psychomotor Activity:  Normal  Concentration: Concentration: Fair  Recall:  [employer] of Knowledge:Fair  Language: Fair  Akathisia:  No  Handed:  Right  AIMS (if indicated):     Assets:  Communication Skills Desire for Improvement Physical Health Social Support  Sleep:       Musculoskeletal: Strength & Muscle Tone: within normal limits Gait & Station: normal Patient leans: N/A  There were no vitals taken for this visit.  Recommendations: Inpatient admision  Based on my evaluation the patient does not appear to have an emergency medical condition.  Oneta Rack, NP 10/06/2019, 6:55 PM

## 2019-10-07 ENCOUNTER — Other Ambulatory Visit: Payer: Self-pay

## 2019-10-07 ENCOUNTER — Encounter (HOSPITAL_COMMUNITY): Payer: Self-pay | Admitting: Family

## 2019-10-07 DIAGNOSIS — F429 Obsessive-compulsive disorder, unspecified: Secondary | ICD-10-CM

## 2019-10-07 DIAGNOSIS — F422 Mixed obsessional thoughts and acts: Secondary | ICD-10-CM

## 2019-10-07 DIAGNOSIS — F332 Major depressive disorder, recurrent severe without psychotic features: Principal | ICD-10-CM

## 2019-10-07 LAB — COMPREHENSIVE METABOLIC PANEL
ALT: 30 U/L (ref 0–44)
AST: 35 U/L (ref 15–41)
Albumin: 4.6 g/dL (ref 3.5–5.0)
Alkaline Phosphatase: 43 U/L (ref 38–126)
Anion gap: 10 (ref 5–15)
BUN: 13 mg/dL (ref 6–20)
CO2: 25 mmol/L (ref 22–32)
Calcium: 9.3 mg/dL (ref 8.9–10.3)
Chloride: 103 mmol/L (ref 98–111)
Creatinine, Ser: 0.72 mg/dL (ref 0.61–1.24)
GFR calc Af Amer: >60 mL/min (ref >60)
GFR calc non Af Amer: >60 mL/min (ref >60)
Glucose, Bld: 123 mg/dL — ABNORMAL HIGH (ref 70–99)
Potassium: 3.5 mmol/L (ref 3.5–5.1)
Sodium: 138 mmol/L (ref 135–145)
Total Bilirubin: 1 mg/dL (ref 0.3–1.2)
Total Protein: 6.9 g/dL (ref 6.5–8.1)

## 2019-10-07 LAB — LIPID PANEL
Cholesterol: 157 mg/dL (ref 0–200)
HDL: 69 mg/dL (ref >40)
LDL Cholesterol: 81 mg/dL (ref 0–99)
Total CHOL/HDL Ratio: 2.3 RATIO
Triglycerides: 33 mg/dL (ref <150)
VLDL: 7 mg/dL (ref 0–40)

## 2019-10-07 LAB — CBC
HCT: 38.3 % — ABNORMAL LOW (ref 39.0–52.0)
Hemoglobin: 12.9 g/dL — ABNORMAL LOW (ref 13.0–17.0)
MCH: 30.6 pg (ref 26.0–34.0)
MCHC: 33.7 g/dL (ref 30.0–36.0)
MCV: 90.8 fL (ref 80.0–100.0)
Platelets: 278 10*3/uL (ref 150–400)
RBC: 4.22 MIL/uL (ref 4.22–5.81)
RDW: 12.4 % (ref 11.5–15.5)
WBC: 5.2 10*3/uL (ref 4.0–10.5)
nRBC: 0 % (ref 0.0–0.2)

## 2019-10-07 LAB — RAPID URINE DRUG SCREEN, HOSP PERFORMED
Amphetamines: NOT DETECTED
Barbiturates: NOT DETECTED
Benzodiazepines: NOT DETECTED
Cocaine: NOT DETECTED
Opiates: NOT DETECTED
Tetrahydrocannabinol: NOT DETECTED

## 2019-10-07 LAB — TSH: TSH: 1.661 u[IU]/mL (ref 0.350–4.500)

## 2019-10-07 MED ORDER — ENSURE ENLIVE PO LIQD
237.0000 mL | Freq: Two times a day (BID) | ORAL | Status: DC
  Administered 2019-10-07 – 2019-10-12 (×12): 237 mL via ORAL

## 2019-10-07 MED ORDER — LORAZEPAM 1 MG PO TABS: 1.0000 mg | ORAL_TABLET | Freq: Four times a day (QID) | ORAL | Status: DC | PRN

## 2019-10-07 MED ORDER — CITALOPRAM HYDROBROMIDE 20 MG PO TABS
20.0000 mg | ORAL_TABLET | Freq: Every day | ORAL | Status: DC
  Administered 2019-10-07 – 2019-10-10 (×4): 20 mg via ORAL
  Filled 2019-10-07 (×6): qty 1

## 2019-10-07 NOTE — Progress Notes (Signed)
[facility] Group Notes:  (Nursing/MHT/Case Management/Adjunct)  Date:  10/07/2019  Time:  2030  Type of Therapy:  wrap up group  Participation Level:  Active  Participation Quality:  Appropriate, Attentive, Sharing and Supportive  Affect:  Anxious  Cognitive:  Alert  Insight:  Improving  Engagement in Group:  Engaged  Modes of Intervention:  Clarification, Education and Support  Summary of Progress/Problems: Positive change and positive thinking were discussed.   Paul [name] 10/07/2019, 9:15 PM

## 2019-10-07 NOTE — Tx Team (Signed)
Initial Treatment Plan 10/07/2019 3:06 AM Quillian Quince [id number]    PATIENT STRESSORS: Financial difficulties Marital or family conflict Occupational concerns   PATIENT STRENGTHS: Average or above average intelligence Motivation for treatment/growth Supportive family/friends   PATIENT IDENTIFIED PROBLEMS: Depression  Suicidal Ideation  Anxiety        "feel better"         DISCHARGE CRITERIA:  Improved stabilization in mood, thinking, and/or behavior Motivation to continue treatment in a less acute level of care Verbal commitment to aftercare and medication compliance  PRELIMINARY DISCHARGE PLAN: Outpatient therapy Return to previous living arrangement  PATIENT/FAMILY INVOLVEMENT: This treatment plan has been presented to and reviewed with the patient, Paul [name].  The patient and family have been given the opportunity to ask questions and make suggestions.  Juliann Pares, RN 10/07/2019, 3:06 AM

## 2019-10-07 NOTE — Progress Notes (Signed)
Paul [name] is a 29 y.o. male Voluntary admitted for increased depression and suicidal ideations. Pt has a hx of OCD, pt stated it has gotten so bad that he stayed up last night in the kitchen cleaning dishes and washing his hands. Pt has not been eating well due OCD and has lost wait. Pt stated he quit his  Because of his OCD. Pt has been calm and cooperative with admission process, denies SI/HI, AVH at this time and contracted for safety. Skin/belongings search completed and pt oriented to unit. Pt stable at this time. Pt given the opportunity to express concerns and ask questions. Pt given toiletries. Will continue to monitor.

## 2019-10-07 NOTE — Progress Notes (Signed)
D. Pt is polite and friendly during interactions- per pt's self inventory, pt rated his depression, hopelessness and anxiety a 3/1/8, respectively. Pt writes that his goal is "to work on my obsession compulsion disorder".  Pt currently denies SI/HI and AVH  A. Labs and vitals monitored. Pt compliant with medications. Pt supported emotionally and encouraged to express concerns and ask questions.   R. Pt remains safe with 15 minute checks. Will continue POC.

## 2019-10-07 NOTE — H&P (Signed)
Psychiatric Admission Assessment Adult  Patient Identification: Paul [name]  MRN:  [id number]  Date of Evaluation:  10/07/2019  Chief Complaint: Worsening symptoms of depression & OCD triggering suicidal ideations.    Principal Diagnosis: Obsessive compulsive disorder  Diagnosis:  Principal Problem:   Obsessive compulsive disorder Active Problems:   Severe recurrent major depression without psychotic features (HCC)  History of Present Illness: This is the second admission assessment in this [facility] for Paul [name], a 29 year old Caucasian male who is in the process of transitional to a male gender. He was last admitted, treated & discharged from [facility] in June of, 2020. He received mood stabilization treatments for his depression. He was discharged with medications & a referral/appointment to an outpatient psychiatric clinic to follw-up for routine psychiatric care & medication management. He is being admitted to the [facility] this time around as walk-in with complaints of severe depression of several weeks, worsening OCD symptoms of (constant cleaning, washing & picking up tiny sticks in the the yard), preoccupied with thinking about his sins, certain actions that he regrets. He came to the hospital for evaluation & stabilization.  During this evaluation, Paul [name] presents alert, oriented & aware of situation. He appears well groomed & anxious. He reports, "My depression, anxiety & OCD  symptoms are so severe within the last several weeks that I feel like I did not want to live any more. I have been spending time after time thinking of a way to kill myself, but, I could not come up with a specific plan. I have not actually attempted suicide, but I had in the past, obtained a large amount of heroin to kill myself, but I did not do it. I have not actually attempted suicide in the past. I don't know the history of my birth family. I was adopted by my current family. I live with my mother since July of last year after  I lost my job. I lost that job because my OCD symptoms were interfering with my job performance. I moved in with my mother, but she has dogs & the dogs are stressing me out because they were bring dirt to the house. I'm constantly up cleaning after them. Then, I planned to move to [facility], but the plan fell through once my car broke down. My depression is tired to my being a trans-gender. I'm not living my life like I want. I don't date like other people do. I'm lonely & yet, not myself. I feel a lot of guilt that I'm doing something wrong. I feel like God is mad at me. I keep praying for forgiveness. I'm mostly worrying about going back home to the same situation after discharge. I just want to get away from this area".  Associated Signs/Symptoms:  Depression Symptoms:  depressed mood, insomnia, psychomotor agitation, feelings of worthlessness/guilt, hopelessness, anxiety,  (Hypo) Manic Symptoms:  Denies any hypomanic symptoms  Anxiety Symptoms:  Excessive Worry, Obsessive Compulsive Symptoms:   Checking, Handwashing,,  Psychotic Symptoms:  Denies any hallucinations, delusions or paranoia  PTSD Symptoms: Denies any PTSD symptoms or events.  Total Time spent with patient: 1 hour  Past Psychiatric History: OCD, anxiety, major depression.  Is the patient at risk to self? No.  Has the patient been a risk to self in the past 6 months? Yes.    Has the patient been a risk to self within the distant past? Yes.    Is the patient a risk to others? No.  Has the patient been  a risk to others in the past 6 months? No.  Has the patient been a risk to others within the distant past? No.   Prior Inpatient Therapy: Prior Inpatient Therapy: Yes Prior Therapy Dates: 02/2019 Prior Therapy Facilty/Provider(s): [facility] Reason for Treatment: Depression, OCD, SI Prior Outpatient Therapy: Prior Outpatient Therapy: Yes Prior Therapy Dates: 2020 Prior Therapy Facilty/Provider(s): Online therapy Reason for  Treatment: Depression, OCD Does patient have an ACCT team?: No Does patient have Intensive In-House Services?  : No Does patient have [facility] services? : No Does patient have P4CC services?: No  Alcohol Screening: 1. How often do you have a drink containing alcohol?: Never 2. How many drinks containing alcohol do you have on a typical day when you are drinking?: 1 or 2 3. How often do you have six or more drinks on one occasion?: Never AUDIT-C Score: 0 4. How often during the last year have you found that you were not able to stop drinking once you had started?: Never 5. How often during the last year have you failed to do what was normally expected from you becasue of drinking?: Never 6. How often during the last year have you needed a first drink in the morning to get yourself going after a heavy drinking session?: Never 7. How often during the last year have you had a feeling of guilt of remorse after drinking?: Never 8. How often during the last year have you been unable to remember what happened the night before because you had been drinking?: Never 9. Have you or someone else been injured as a result of your drinking?: No 10. Has a relative or friend or a doctor or another health worker been concerned about your drinking or suggested you cut down?: No Alcohol Use Disorder Identification Test Final Score (AUDIT): 0  Substance Abuse History in the last 12 months:  No.  Consequences of Substance Abuse: NA  Previous Psychotropic Medications: Sertraline, Hydroxyzine, Trazodone  Psychological Evaluations: No   Past Medical History:  Past Medical History:  Diagnosis Date  . Male-to-male transgender person   . Medical history non-contributory     Past Surgical History:  Procedure Laterality Date  . NO PAST SURGERIES     Family History: History reviewed. No pertinent family history.  Family Psychiatric  History: "I was adopted, I do not know much about birth family's mental  health hx".  Tobacco Screening: "I do not smoke or use tobacco products".  Social History: Single, has no children, unemployed, lives in [place], [place] with his mother. Social History   Substance and Sexual Activity  Alcohol Use Not Currently     Social History   Substance and Sexual Activity  Drug Use Never    Additional Social History: Marital status: Single Pain Medications: See MAR Prescriptions: See MAR Over the Counter: See MAR History of alcohol / drug use?: Yes Longest period of sobriety (when/how long): unknown  Allergies:  No Known Allergies  Lab Results:  Results for orders placed or performed during the hospital encounter of 10/06/19 (from the past 48 hour(s))  Respiratory Panel by RT PCR (Flu A&B, Covid) - Nasopharyngeal Swab     Status: None   Collection Time: 10/06/19  8:29 PM   Specimen: Nasopharyngeal Swab  Result Value Ref Range   SARS Coronavirus 2 by RT PCR NEGATIVE NEGATIVE    Comment: (NOTE) SARS-CoV-2 target nucleic acids are NOT DETECTED. The SARS-CoV-2 RNA is generally detectable in upper respiratoy specimens during the acute  phase of infection. The lowest concentration of SARS-CoV-2 viral copies this assay can detect is 131 copies/mL. A negative result does not preclude SARS-Cov-2 infection and should not be used as the sole basis for treatment or other patient management decisions. A negative result may occur with  improper specimen collection/handling, submission of specimen other than nasopharyngeal swab, presence of viral mutation(s) within the areas targeted by this assay, and inadequate number of viral copies (<131 copies/mL). A negative result must be combined with clinical observations, patient history, and epidemiological information. The expected result is Negative. Fact Sheet for Patients:  https://www.moore.com/ Fact Sheet for Healthcare Providers:  https://www.young.biz/ This test is not yet  ap proved or cleared by the [place] FDA and  has been authorized for detection and/or diagnosis of SARS-CoV-2 by FDA under an Emergency Use Authorization (EUA). This EUA will remain  in effect (meaning this test can be used) for the duration of the COVID-19 declaration under Section 564(b)(1) of the Act, 21 U.S.C. section 360bbb-3(b)(1), unless the authorization is terminated or revoked sooner.    Influenza A by PCR NEGATIVE NEGATIVE   Influenza B by PCR NEGATIVE NEGATIVE    Comment: (NOTE) The Xpert Xpress SARS-CoV-2/FLU/RSV assay is intended as an aid in  the diagnosis of influenza from Nasopharyngeal swab specimens and  should not be used as a sole basis for treatment. Nasal washings and  aspirates are unacceptable for Xpert Xpress SARS-CoV-2/FLU/RSV  testing. Fact Sheet for Patients: https://www.moore.com/ Fact Sheet for Healthcare Providers: https://www.young.biz/ This test is not yet approved or cleared by the [place] FDA and  has been authorized for detection and/or diagnosis of SARS-CoV-2 by  FDA under an Emergency Use Authorization (EUA). This EUA will remain  in effect (meaning this test can be used) for the duration of the  Covid-19 declaration under Section 564(b)(1) of the Act, 21  U.S.C. section 360bbb-3(b)(1), unless the authorization is  terminated or revoked. Performed at [facility], [address] [place], [place] [address]   Urine rapid drug screen (hosp performed)not at [facility]     Status: None   Collection Time: 10/06/19 11:01 PM  Result Value Ref Range   Opiates NONE DETECTED NONE DETECTED   Cocaine NONE DETECTED NONE DETECTED   Benzodiazepines NONE DETECTED NONE DETECTED   Amphetamines NONE DETECTED NONE DETECTED   Tetrahydrocannabinol NONE DETECTED NONE DETECTED   Barbiturates NONE DETECTED NONE DETECTED    Comment: (NOTE) DRUG SCREEN FOR MEDICAL PURPOSES ONLY.  IF CONFIRMATION IS  NEEDED FOR ANY PURPOSE, NOTIFY LAB WITHIN 5 DAYS. LOWEST DETECTABLE LIMITS FOR URINE DRUG SCREEN Drug Class                     Cutoff (ng/mL) Amphetamine and metabolites    1000 Barbiturate and metabolites    200 Benzodiazepine                 200 Tricyclics and metabolites     300 Opiates and metabolites        300 Cocaine and metabolites        300 THC                            50 Performed at [facility], [address] [place], [place] [address]   CBC     Status: Abnormal   Collection Time: 10/07/19  6:46 AM  Result Value Ref Range   WBC  5.2 4.0 - 10.5 K/uL   RBC 4.22 4.22 - 5.81 MIL/uL   Hemoglobin 12.9 (L) 13.0 - 17.0 g/dL   HCT 81.138.3 (L) 91.439.0 - 78.252.0 %   MCV 90.8 80.0 - 100.0 fL   MCH 30.6 26.0 - 34.0 pg   MCHC 33.7 30.0 - 36.0 g/dL   RDW 95.612.4 21.311.5 - 08.615.5 %   Platelets 278 150 - 400 K/uL   nRBC 0.0 0.0 - 0.2 %    Comment: Performed at [facility], [address] [place], [place] [address]  Comprehensive metabolic panel     Status: Abnormal   Collection Time: 10/07/19  6:46 AM  Result Value Ref Range   Sodium 138 135 - 145 mmol/L   Potassium 3.5 3.5 - 5.1 mmol/L   Chloride 103 98 - 111 mmol/L   CO2 25 22 - 32 mmol/L   Glucose, Bld 123 (H) 70 - 99 mg/dL   BUN 13 6 - 20 mg/dL   Creatinine, Ser 9.620.72 0.61 - 1.24 mg/dL   Calcium 9.3 8.9 - 95.210.3 mg/dL   Total Protein 6.9 6.5 - 8.1 g/dL   Albumin 4.6 3.5 - 5.0 g/dL   AST 35 15 - 41 U/L   ALT 30 0 - 44 U/L   Alkaline Phosphatase 43 38 - 126 U/L   Total Bilirubin 1.0 0.3 - 1.2 mg/dL   GFR calc non Af Amer >60 >60 mL/min   GFR calc Af Amer >60 >60 mL/min   Anion gap 10 5 - 15    Comment: Performed at [facility], [address] [place], [place] [address]  Lipid panel     Status: None   Collection Time: 10/07/19  6:46 AM  Result Value Ref Range   Cholesterol 157 0 - 200 mg/dL   Triglycerides 33 <440<150 mg/dL   HDL 69 >10>40 mg/dL   Total CHOL/HDL Ratio 2.3  RATIO   VLDL 7 0 - 40 mg/dL   LDL Cholesterol 81 0 - 99 mg/dL    Comment:        Total Cholesterol/HDL:CHD Risk Coronary Heart Disease Risk Table                     Men   Women  1/2 Average Risk   3.4   3.3  Average Risk       5.0   4.4  2 X Average Risk   9.6   7.1  3 X Average Risk  23.4   11.0        Use the calculated Patient Ratio above and the CHD Risk Table to determine the patient's CHD Risk.        ATP III CLASSIFICATION (LDL):  <100     mg/dL   Optimal  [id number]  mg/dL   Near or Above                    Optimal  130-159  mg/dL   Borderline  [phone]  mg/dL   High  >742>190     mg/dL   Very High Performed at [facility], [address] [place], [place] [address]   TSH     Status: None   Collection Time: 10/07/19  6:46 AM  Result Value Ref Range   TSH 1.661 0.350 - 4.500 uIU/mL    Comment: Performed by a 3rd Generation assay with a functional sensitivity of <=0.01 uIU/mL. Performed at [facility],  [address], [place], [place] [address]    Blood Alcohol level:  Lab Results  Component Value Date   ETH <10 03/13/2019   Metabolic Disorder Labs:  Lab Results  Component Value Date   HGBA1C 4.6 (L) 03/15/2019   MPG 85.32 03/15/2019   No results found for: PROLACTIN Lab Results  Component Value Date   CHOL 157 10/07/2019   TRIG 33 10/07/2019   HDL 69 10/07/2019   CHOLHDL 2.3 10/07/2019   VLDL 7 10/07/2019   LDLCALC 81 10/07/2019   LDLCALC 117 (H) 03/15/2019   Current Medications: Current Facility-Administered Medications  Medication Dose Route Frequency Provider Last Rate Last Admin  . acetaminophen (TYLENOL) tablet 650 mg  650 mg Oral Q6H PRN Nira Conn A, NP      . alum & mag hydroxide-simeth (MAALOX/MYLANTA) 200-200-20 MG/5ML suspension 30 mL  30 mL Oral Q4H PRN Nira Conn A, NP      . citalopram (CELEXA) tablet 20 mg  20 mg Oral Daily Antonieta Pert, MD   20 mg at 10/07/19 0909  . dutasteride (AVODART)  capsule 0.5 mg  0.5 mg Oral Daily Nira Conn A, NP      . feeding supplement (ENSURE ENLIVE) (ENSURE ENLIVE) liquid 237 mL  237 mL Oral BID BM Antonieta Pert, MD   237 mL at 10/07/19 0945  . hydrOXYzine (ATARAX/VISTARIL) tablet 25 mg  25 mg Oral TID PRN Jackelyn Poling, NP      . magnesium hydroxide (MILK OF MAGNESIA) suspension 30 mL  30 mL Oral Daily PRN Nira Conn A, NP      . traZODone (DESYREL) tablet 50 mg  50 mg Oral QHS PRN Jackelyn Poling, NP       PTA Medications: Medications Prior to Admission  Medication Sig Dispense Refill Last Dose  . dutasteride (AVODART) 0.5 MG capsule Take 0.5 mg by mouth daily.     Marland Kitchen ESTRADIOL VALERATE IM Inject 0.5 mLs into the muscle every Saturday. 100mg /46mL Concentration     . hydrOXYzine (ATARAX/VISTARIL) 25 MG tablet Take 1 tablet (25 mg total) by mouth 3 (three) times daily as needed for anxiety. 30 tablet 0   . sertraline (ZOLOFT) 25 MG tablet Take 3 tablets (75 mg total) by mouth daily. 90 tablet 0   . traZODone (DESYREL) 50 MG tablet Take 1 tablet (50 mg total) by mouth at bedtime as needed for sleep. 30 tablet 0    Musculoskeletal: Strength & Muscle Tone: within normal limits Gait & Station: normal Patient leans: N/A  Psychiatric Specialty Exam: Physical Exam  Nursing note and vitals reviewed. Constitutional: He appears well-developed.  HENT:  Head: Normocephalic.  Eyes: Pupils are equal, round, and reactive to light.  Cardiovascular: Normal rate.  Respiratory: Effort normal.  Genitourinary:    Genitourinary Comments: Deferred   Musculoskeletal:        General: Normal range of motion.     Cervical back: Normal range of motion.  Neurological: He is alert.  Skin: Skin is warm and dry.    Review of Systems  Constitutional: Negative for chills, diaphoresis and fever.  HENT: Negative for congestion, rhinorrhea and sore throat.   Respiratory: Negative for cough, shortness of breath and wheezing.   Cardiovascular: Negative for  chest pain and palpitations.  Gastrointestinal: Negative for diarrhea, nausea and vomiting.  Endocrine: Negative for cold intolerance.  Genitourinary: Negative for difficulty urinating.  Skin: Negative for color change.  Allergic/Immunologic: Negative for environmental allergies and food allergies.  Neurological: Negative for dizziness and headaches.  Psychiatric/Behavioral: Positive for dysphoric mood, sleep disturbance and suicidal ideas. Negative for agitation, behavioral problems, confusion, decreased concentration, hallucinations and self-injury. The patient is nervous/anxious. The patient is not hyperactive.     Blood pressure 125/89, pulse 80, temperature 97.9 F (36.6 C), temperature source Oral, resp. rate 18, height 5\' 11"  (1.803 m), weight 77.6 kg, SpO2 100 %.Body mass index is 23.85 kg/m.  General Appearance: Casual and Well Groomed  Eye Contact:  Fair  Speech:  Clear and Coherent and Normal Rate  Volume:  Normal  Mood:  Anxious and Depressed  Affect:  Congruent and Flat  Thought Process:  Coherent and Descriptions of Associations: Intact  Orientation:  Full (Time, Place, and Person)  Thought Content:  Ruminations, denies any hallucination, delusions or paranoia  Suicidal Thoughts:  Currently denies any thoughts, plans or intent  Homicidal Thoughts:  Denies  Memory:  Immediate;   Good Recent;   Good Remote;   Good  Judgement:  Fair  Insight:  Fair  Psychomotor Activity:  Normal  Concentration:  Concentration: Good and Attention Span: Good  Recall:  Good  Fund of Knowledge:  Fair  Language:  Good  Akathisia:  NA  Handed:  Right  AIMS (if indicated):     Assets:  Communication Skills Desire for Improvement Social Support  ADL's:  Intact  Cognition:  WNL  Sleep:  Number of Hours: 3.5   Treatment Plan Summary: Daily contact with patient to assess and evaluate symptoms and progress in treatment and Medication management.  Treatment Plan/Recommendations:  1. Admit  for crisis management and stabilization, estimated length of stay 3-5 days.    2. Medication management to reduce current symptoms to base line and improve the patient's overall level of functioning: See MAR, Md's SRA & treatment plan.   Observation Level/Precautions:  15 minute checks  Laboratory:  Per ED  Psychotherapy: Group sessions   [facility]  Consultations: As needed.   Discharge Concerns: Safety, mood stability.   Estimated LOS: 3-5 days  Other: Admit to the 300-hall.    Physician Treatment Plan for Primary Diagnosis: Obsessive compulsive disorder  Long Term Goal(s): Improvement in symptoms so as ready for discharge  Short Term Goals: Ability to verbalize feelings will improve, Ability to disclose and discuss suicidal ideas and Ability to demonstrate self-control will improve  Physician Treatment Plan for Secondary Diagnosis: Principal Problem:   Obsessive compulsive disorder Active Problems:   Severe recurrent major depression without psychotic features (HCC)  Long Term Goal(s): Improvement in symptoms so as ready for discharge  Short Term Goals: Ability to identify and develop effective coping behaviors will improve, Ability to maintain clinical measurements within normal limits will improve and Compliance with prescribed medications will improve  I certify that inpatient services furnished can reasonably be expected to improve the patient's condition.    Armandina StammerAgnes Maddelynn Moosman, NP, PMHNP, PMHNP-BC 1/17/202111:12 AM

## 2019-10-07 NOTE — Progress Notes (Signed)
   10/07/19 2036  COVID-19 Daily Checkoff  Have you had a fever (temp > 37.80C/100F)  in the past 24 hours?  No  If you have had runny nose, nasal congestion, sneezing in the past 24 hours, has it worsened? No  COVID-19 EXPOSURE  Have you traveled outside the state in the past 14 days? No  Have you been in contact with someone with a confirmed diagnosis of COVID-19 or PUI in the past 14 days without wearing appropriate PPE? No  Have you been living in the same home as a person with confirmed diagnosis of COVID-19 or a PUI (household contact)? No  Have you been diagnosed with COVID-19? No

## 2019-10-07 NOTE — [facility] Group Notes (Signed)
[facility] LCSW Group Therapy Note  Date/Time:  10/07/2019 10:00-11:00AM  Type of Therapy and Topic:  Group Therapy:  Healthy and Unhealthy Supports plus being "My Own Hero"  Participation Level:  Active   Description of Group:  Patients in this group were invited to identify the differences between healthy and unhealthy supports and then to identify those people in their lives who fall into one category or the other, as well as why.  They were then introduced to the idea of adding more healthy supports and decrease the unhealthy ones.  Patients discussed what additional healthy supports could be helpful in their recovery and wellness after discharge in order to prevent future hospitalizations.   An emphasis was placed on using counselor, doctor, therapy groups, 12-step groups, and problem-specific support groups to expand supports.  They asked each other for ideas on how to handle several situations in their lives, and were receptive to the suggestions.  At the end of group, a song "My Own Hero" was played to encourage people to be full participants in their own recovery journey instead of expecting others to do the work for them.  Therapeutic Goals:   1)  discuss importance of adding supports to stay well once out of the hospital  2)  compare healthy versus unhealthy supports and identify some examples of each  3)  generate ideas and descriptions of healthy supports that can be added  4)  offer mutual support about how to address unhealthy supports  5)  encourage active participation in and adherence to discharge plan    Summary of Patient Progress:  The patient stated that current healthy supports in his life are God and his best friend plus at times his mother, while current unhealthy supports include his mother at times.  This is because she does not understand what he is going through with his obsessive-compulsive disorder and depression and he can start feeling guilty because he stresses  her out.  The patient expressed a willingness to add long-term treatment as support(s) to help in his recovery journey.   Therapeutic Modalities:   Motivational Interviewing Brief Solution-Focused Therapy  Ambrose Mantle, LCSW

## 2019-10-07 NOTE — BHH Suicide Risk Assessment (Signed)
[facility] Admission Suicide Risk Assessment   Nursing information obtained from:  Patient Demographic factors:  Gay, lesbian, or bisexual orientation, Unemployed, Caucasian Current Mental Status:  NA Loss Factors:  Financial problems / change in socioeconomic status Historical Factors:  Prior suicide attempts Risk Reduction Factors:  Living with another person, especially a relative, Religious beliefs about death  Total Time spent with patient: 30 minutes Principal Problem: <principal problem not specified> Diagnosis:  Active Problems:   Severe recurrent major depression without psychotic features (HCC)  Subjective Data: Patient is seen and examined.  Patient is a 29 year old transgender male to male who presented to the behavioral health hospital yesterday as a walk-in evaluation.  Patient stated that his mood and anxiety symptoms were worsening, and that his obsessive-compulsive disorder symptoms were poorly controlled.  He stated that recently the obsessive-compulsive symptoms were taking over his life, and he was "hyperfocus" on things and spent hours in his home cleaning.  He stated he was feeling guilty about previous "sins" that he had had in the past, and that he had decided he no longer wanted to live.  He had previously attempted suicide in June 2020 with a large dose of heroin to kill himself.  He was hospitalized at our facility at that time.  He was discharged on Zoloft.  He stated the Zoloft made his "head feel funny" and he stopped it.  He stated that he had been placed on Celexa 10 mg a day recently.  He stated he did not find that that was effective at this point.  He stated he is being seen by his family practice clinic, and goes [facility] his hormones for his transgender issues.  He admitted to helplessness, hopelessness and worthlessness.  He admitted to suicidal ideation.  He was admitted to the hospital for evaluation and stabilization.  Continued Clinical Symptoms:   Alcohol Use Disorder Identification Test Final Score (AUDIT): 0 The "Alcohol Use Disorders Identification Test", Guidelines for Use in Primary Care, Second Edition.  World Science writer [facility]). Score between 0-7:  no or low risk or alcohol related problems. Score between 8-15:  moderate risk of alcohol related problems. Score between 16-19:  high risk of alcohol related problems. Score 20 or above:  warrants further diagnostic evaluation for alcohol dependence and treatment.   CLINICAL FACTORS:   Severe Anxiety and/or Agitation Depression:   Anhedonia Hopelessness Impulsivity Insomnia Obsessive-Compulsive Disorder   Musculoskeletal: Strength & Muscle Tone: within normal limits Gait & Station: normal Patient leans: N/A  Psychiatric Specialty Exam: Physical Exam  Nursing note and vitals reviewed. Constitutional: He is oriented to person, place, and time. He appears well-developed and well-nourished.  HENT:  Head: Normocephalic and atraumatic.  Respiratory: Effort normal.  Neurological: He is alert and oriented to person, place, and time.    Review of Systems  Blood pressure 125/89, pulse 80, temperature 97.9 F (36.6 C), temperature source Oral, resp. rate 18, height 5\' 11"  (1.803 m), weight 77.6 kg, SpO2 100 %.Body mass index is 23.85 kg/m.  General Appearance: Disheveled  Eye Contact:  Minimal  Speech:  Slow  Volume:  Decreased  Mood:  Depressed  Affect:  Congruent  Thought Process:  Coherent and Descriptions of Associations: Intact  Orientation:  Full (Time, Place, and Person)  Thought Content:  Obsessions and Rumination  Suicidal Thoughts:  Yes.  without intent/plan  Homicidal Thoughts:  No  Memory:  Immediate;   Good Recent;   Good Remote;   Good  Judgement:  Intact  Insight:  Fair  Psychomotor Activity:  Psychomotor Retardation  Concentration:  Concentration: Good and Attention Span: Good  Recall:  Good  Fund of Knowledge:  Good  Language:  Good   Akathisia:  Negative  Handed:  Right  AIMS (if indicated):     Assets:  Desire for Improvement Housing Resilience  ADL's:  Intact  Cognition:  WNL  Sleep:  Number of Hours: 3.5      COGNITIVE FEATURES THAT CONTRIBUTE TO RISK:  None    SUICIDE RISK:   Moderate:  Frequent suicidal ideation with limited intensity, and duration, some specificity in terms of plans, no associated intent, good self-control, limited dysphoria/symptomatology, some risk factors present, and identifiable protective factors, including available and accessible social support.  PLAN OF CARE: Patient is seen and examined.  Patient is a 29 year old transgender male to male who was admitted secondary to suicidal ideation, worsening depression and obsessive-compulsive disorder.  She will be admitted to the hospital.  She will be integrated into the milieu.  She will be encouraged to attend groups.  She is only on Celexa 10 mg p.o. daily, and I will increase that to 20 mg p.o. daily.  She will also have available hydroxyzine for anxiety as well as trazodone for sleep.  She receives her hormones at [facility], and has received the estrogen injection within the last month or so.  She is on Avodart as well and that will be continued.  We will also contact family for collateral information.  Review of her electrolytes showed a mildly elevated glucose at 123.  Normal liver function enzymes.  A mild anemia with a hemoglobin of 12.9 and hematocrit 38.3.  TSH was 1.661.  I certify that inpatient services furnished can reasonably be expected to improve the patient's condition.   Sharma Covert, MD 10/07/2019, 10:07 AM
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
## 2019-10-07 NOTE — Progress Notes (Signed)
   10/07/19 2038  Psych Admission Type (Psych Patients Only)  Admission Status Voluntary  Psychosocial Assessment  Patient Complaints None  Eye Contact Fair  Facial Expression Anxious  Affect Appropriate to circumstance  Speech Logical/coherent  Interaction Assertive  Motor Activity Other (Comment) (WDL)  Appearance/Hygiene Unremarkable  Behavior Characteristics Appropriate to situation  Mood Depressed;Anxious;Pleasant;Guilty  Thought Process  Coherency WDL  Content WDL  Delusions None reported or observed  Perception WDL  Hallucination None reported or observed  Judgment Impaired  Confusion None  Danger to Self  Current suicidal ideation? Denies  Danger to Others  Danger to Others None reported or observed

## 2019-10-08 NOTE — Progress Notes (Signed)
Spiritual care group on grief and loss facilitated by chaplain Burnis Kingfisher MD[facility], [facility]  Group Goal:  Support / Education around grief and loss Members engage in facilitated group support and psycho-social education.  Group Description:  Following introductions and group rules, group members engaged in facilitated group dialog and support around topic of loss, with particular support around experiences of loss in their lives. Group Identified types of loss (relationships / self / things) and identified patterns, circumstances, and changes that precipitate losses. Reflected on thoughts / feelings around loss, normalized grief responses, and recognized variety in grief experience.   Group noted Worden's four tasks of grief in discussion.  Group drew on Adlerian / Rogerian, narrative, MI, Patient Progress:    Present throughout group.  Attentive to group discussion and offered encouragement to another group member who was apologetic for taking group space.  Paul [name] noted the value of "having space to vent."

## 2019-10-08 NOTE — Tx Team (Signed)
Interdisciplinary Treatment and Diagnostic Plan Update  10/08/2019 Time of Session: 9:00am Paul [name] MRN: [id number]  Principal Diagnosis: Obsessive compulsive disorder  Secondary Diagnoses: Principal Problem:   Obsessive compulsive disorder Active Problems:   Severe recurrent major depression without psychotic features (HCC)   Current Medications:  Current Facility-Administered Medications  Medication Dose Route Frequency Provider Last Rate Last Admin  . acetaminophen (TYLENOL) tablet 650 mg  650 mg Oral Q6H PRN Nira Conn A, NP      . alum & mag hydroxide-simeth (MAALOX/MYLANTA) 200-200-20 MG/5ML suspension 30 mL  30 mL Oral Q4H PRN Nira Conn A, NP      . citalopram (CELEXA) tablet 20 mg  20 mg Oral Daily Antonieta Pert, MD   20 mg at 10/08/19 0803  . dutasteride (AVODART) capsule 0.5 mg  0.5 mg Oral Daily Nira Conn A, NP      . feeding supplement (ENSURE ENLIVE) (ENSURE ENLIVE) liquid 237 mL  237 mL Oral BID BM Antonieta Pert, MD   237 mL at 10/07/19 1949  . hydrOXYzine (ATARAX/VISTARIL) tablet 25 mg  25 mg Oral TID PRN Jackelyn Poling, NP      . magnesium hydroxide (MILK OF MAGNESIA) suspension 30 mL  30 mL Oral Daily PRN Nira Conn A, NP      . traZODone (DESYREL) tablet 50 mg  50 mg Oral QHS PRN Jackelyn Poling, NP       PTA Medications: Medications Prior to Admission  Medication Sig Dispense Refill Last Dose  . acetaminophen (TYLENOL) 325 MG tablet Take 650 mg by mouth every 6 (six) hours as needed for mild pain or headache.     . amphetamine-dextroamphetamine (ADDERALL) 10 MG tablet Take 10 mg by mouth daily with breakfast.     . citalopram (CELEXA) 10 MG tablet Take 10 mg by mouth daily.     Marland Kitchen ibuprofen (ADVIL) 400 MG tablet Take 400 mg by mouth every 6 (six) hours as needed for headache or mild pain.     Marland Kitchen dutasteride (AVODART) 0.5 MG capsule Take 0.5 mg by mouth daily.     Marland Kitchen ESTRADIOL VALERATE IM Inject 0.5 mLs into the muscle every Saturday. 100mg /52mL  Concentration     . hydrOXYzine (ATARAX/VISTARIL) 25 MG tablet Take 1 tablet (25 mg total) by mouth 3 (three) times daily as needed for anxiety. 30 tablet 0   . sertraline (ZOLOFT) 25 MG tablet Take 3 tablets (75 mg total) by mouth daily. (Patient not taking: Reported on 10/07/2019) 90 tablet 0 Not Taking at Unknown time  . traZODone (DESYREL) 50 MG tablet Take 1 tablet (50 mg total) by mouth at bedtime as needed for sleep. (Patient not taking: Reported on 10/07/2019) 30 tablet 0 Not Taking at Unknown time    Patient Stressors: Financial difficulties Marital or family conflict Occupational concerns  Patient Strengths: Average or above average intelligence Motivation for treatment/growth Supportive family/friends  Treatment Modalities: Medication Management, Group therapy, Case management,  1 to 1 session with clinician, Psychoeducation, Recreational therapy.   Physician Treatment Plan for Primary Diagnosis: Obsessive compulsive disorder Long Term Goal(s): Improvement in symptoms so as ready for discharge Improvement in symptoms so as ready for discharge   Short Term Goals: Ability to verbalize feelings will improve Ability to disclose and discuss suicidal ideas Ability to demonstrate self-control will improve Ability to identify and develop effective coping behaviors will improve Ability to maintain clinical measurements within normal limits will improve Compliance with prescribed medications will improve  Medication Management: Evaluate patient's response, side effects, and tolerance of medication regimen.  Therapeutic Interventions: 1 to 1 sessions, Unit Group sessions and Medication administration.  Evaluation of Outcomes: Progressing  Physician Treatment Plan for Secondary Diagnosis: Principal Problem:   Obsessive compulsive disorder Active Problems:   Severe recurrent major depression without psychotic features ([facility])  Long Term Goal(s): Improvement in symptoms so as ready  for discharge Improvement in symptoms so as ready for discharge   Short Term Goals: Ability to verbalize feelings will improve Ability to disclose and discuss suicidal ideas Ability to demonstrate self-control will improve Ability to identify and develop effective coping behaviors will improve Ability to maintain clinical measurements within normal limits will improve Compliance with prescribed medications will improve     Medication Management: Evaluate patient's response, side effects, and tolerance of medication regimen.  Therapeutic Interventions: 1 to 1 sessions, Unit Group sessions and Medication administration.  Evaluation of Outcomes: Progressing   RN Treatment Plan for Primary Diagnosis: Obsessive compulsive disorder Long Term Goal(s): Knowledge of disease and therapeutic regimen to maintain health will improve  Short Term Goals: Ability to verbalize feelings will improve, Ability to disclose and discuss suicidal ideas and Compliance with prescribed medications will improve  Medication Management: RN will administer medications as ordered by provider, will assess and evaluate patient's response and provide education to patient for prescribed medication. RN will report any adverse and/or side effects to prescribing provider.  Therapeutic Interventions: 1 on 1 counseling sessions, Psychoeducation, Medication administration, Evaluate responses to treatment, Monitor vital signs and CBGs as ordered, Perform/monitor CIWA, COWS, AIMS and Fall Risk screenings as ordered, Perform wound care treatments as ordered.  Evaluation of Outcomes: Progressing   LCSW Treatment Plan for Primary Diagnosis: Obsessive compulsive disorder Long Term Goal(s): Safe transition to appropriate next level of care at discharge, Engage patient in therapeutic group addressing interpersonal concerns.  Short Term Goals: Engage patient in aftercare planning with referrals and resources, Increase social support,  Increase emotional regulation, Identify triggers associated with mental health/substance abuse issues and Increase skills for wellness and recovery  Therapeutic Interventions: Assess for all discharge needs, 1 to 1 time with Social worker, Explore available resources and support systems, Assess for adequacy in community support network, Educate family and significant other(s) on suicide prevention, Complete Psychosocial Assessment, Interpersonal group therapy.  Evaluation of Outcomes: Progressing  Progress in Treatment: Attending groups: Yes. Participating in groups: Yes. Taking medication as prescribed: Yes. Toleration medication: Yes. Family/Significant other contact made: No, will contact:  mother. Patient understands diagnosis: Yes. Discussing patient identified problems/goals with staff: Yes. Medical problems stabilized or resolved: No. Denies suicidal/homicidal ideation: Yes. Issues/concerns per patient self-inventory: Yes.  New problem(s) identified: Yes, Describe:  stressful home environment  New Short Term/Long Term Goal(s):  medication management for mood stabilization; elimination of SI thoughts; development of comprehensive mental wellness/sobriety plan.  Patient Goals: (Work on) "my OCD and worrying."  Discharge Plan or Barriers: Patient is interested in IOP or residential treatment for mental health. Current with [facility] for outpatient.   Reason for Continuation of Hospitalization: Anxiety Depression Medication stabilization  Estimated Length of Stay: 3-5 days  Attendees: Patient: Paul [name]  10/08/2019 9:52 AM  Physician: Queen Blossom 10/08/2019 9:52 AM  Nursing: Rise Paganini, RN 10/08/2019 9:52 AM  RN Care Manager: 10/08/2019 9:52 AM  Social Worker: Stephanie Acre, [place] 10/08/2019 9:52 AM  Recreational Therapist:  10/08/2019 9:52 AM  Other: Harriett Sine, NP 10/08/2019 9:52 AM  Other:  10/08/2019 9:52 AM  Other: 10/08/2019 9:52  AM    Scribe for Treatment Team: Darreld Mclean,  Theresia Majors 10/08/2019 9:52 AM

## 2019-10-08 NOTE — Progress Notes (Signed)
The patient's positive event for the day was that he had a "pretty relaxing" day. He also states that he felt peaceful today and that he worried less today. His goal for tomorrow is to be less anxious and to "focus on the moment".

## 2019-10-08 NOTE — Progress Notes (Signed)
D: Patient denied SI, HI, and A/V hallucinations. Patient self inventory, slept good last night. Good appetite with normal energy level. Good concentration. Depression rated #3. Denied hopelessness. Anxiety rated #7. Denies withdrawal symptoms. Denies physical pain or physical problems. Goal today is "my OCD obsessive compulsive disorder". Plans on meeting this goal with prayer.   A: Patient compliant with medications. Denies side effects from medications. Patient supported emotionally and encouraged to express concerns and ask questions.  R: Denies SI, HI, and A/V hallucinations. Maintained safety with every 15 minute checks.

## 2019-10-08 NOTE — [facility] Group Notes (Signed)
LCSW Group Therapy Note 10/08/2019 2:50 PM  Type of Therapy and Topic: Group Therapy: Overcoming Obstacles  Participation Level: Did Not Attend  Description of Group:  In this group patients will be encouraged to explore what they see as obstacles to their own wellness and recovery. They will be guided to discuss their thoughts, feelings, and behaviors related to these obstacles. The group will process together ways to cope with barriers, with attention given to specific choices patients can make. Each patient will be challenged to identify changes they are motivated to make in order to overcome their obstacles. This group will be process-oriented, with patients participating in exploration of their own experiences as well as giving and receiving support and challenge from other group members.  Therapeutic Goals: 1. Patient will identify personal and current obstacles as they relate to admission. 2. Patient will identify barriers that currently interfere with their wellness or overcoming obstacles.  3. Patient will identify feelings, thought process and behaviors related to these barriers. 4. Patient will identify two changes they are willing to make to overcome these obstacles:   Summary of Patient Progress  Invited, chose not to attend.    Therapeutic Modalities:  Cognitive Behavioral Therapy Solution Focused Therapy Motivational Interviewing Relapse Prevention Therapy   Zionna Homewood LCSWA Clinical Social Worker   

## 2019-10-08 NOTE — Progress Notes (Signed)
[facility]  10/08/2019 10:27 AM Paul [name]  MRN:  [id number] Subjective:  "I feel better, not as stressed."  Patient found sitting in the dayroom. He states preference for he/him pronouns. He reports some improvement in depression and anxiety levels over the past day. He had been having "agonizing depression" at home and admits to overtaking his prescribed Adderall to try to help with low energy and motivation. He expresses insight that the Adderall made the problem worse, particularly with his anxiety levels. We discussed risks of stimulant abuse, and he states intent to avoid stimulants in the future. He had stopped showering, eating, and sleeping at home and had been cleaning excessively. He reports appetite and sleep are much improved since hospitalization. He moved in with his mother after the death of his stepfather this summer, and feels that living with her has worsened his OCD symptoms, particularly with cleaning up after her three dogs. Celexa was increased yesterday. He reports he is better able to control thoughts related to cleaning/fixing behaviors. He does admit to continuing passive SI but denies plan or intent and states he would never kill himself due to not wanting to hurt his family. He has been active and visible in the unit milieu.  From admission H&P: Patient is a 29 year old transgender male to male who presented to the behavioral health hospital yesterday as a walk-in evaluation.  Patient stated that his mood and anxiety symptoms were worsening, and that his obsessive-compulsive disorder symptoms were poorly controlled.   Principal Problem: Obsessive compulsive disorder Diagnosis: Principal Problem:   Obsessive compulsive disorder Active Problems:   Severe recurrent major depression without psychotic features (HCC)  Total Time spent with patient: 15 minutes  Past Psychiatric History: See admission H&P  Past Medical History:  Past Medical History:  Diagnosis  Date  . Male-to-male transgender person   . Medical history non-contributory     Past Surgical History:  Procedure Laterality Date  . NO PAST SURGERIES     Family History: History reviewed. No pertinent family history. Family Psychiatric  History: See admission H&P Social History:  Social History   Substance and Sexual Activity  Alcohol Use Not Currently     Social History   Substance and Sexual Activity  Drug Use Never    Social History   Socioeconomic History  . Marital status: Single    Spouse name: Not on file  . Number of children: Not on file  . Years of education: Not on file  . Highest education level: Not on file  Occupational History  . Not on file  Tobacco Use  . Smoking status: Never Smoker  . Smokeless tobacco: Never Used  Substance and Sexual Activity  . Alcohol use: Not Currently  . Drug use: Never  . Sexual activity: Not Currently  Other Topics Concern  . Not on file  Social History Narrative  . Not on file   Social Determinants of Health   Financial Resource Strain:   . Difficulty of Paying Living Expenses: Not on file  Food Insecurity:   . Worried About Programme researcher, broadcasting/film/videounning Out of Food in the Last Year: Not on file  . Ran Out of Food in the Last Year: Not on file  Transportation Needs:   . Lack of Transportation (Medical): Not on file  . Lack of Transportation (Non-Medical): Not on file  Physical Activity:   . Days of Exercise per Week: Not on file  . Minutes of Exercise per Session: Not on file  Stress:   . Feeling of Stress : Not on file  Social Connections:   . Frequency of Communication with Friends and Family: Not on file  . Frequency of Social Gatherings with Friends and Family: Not on file  . Attends Religious Services: Not on file  . Active Member of Clubs or Organizations: Not on file  . Attends Archivist Meetings: Not on file  . Marital Status: Not on file   Additional Social History:    Pain Medications: See  MAR Prescriptions: See MAR Over the Counter: See MAR History of alcohol / drug use?: Yes Longest period of sobriety (when/how long): unknown                    Sleep: Good  Appetite:  Good  Current Medications: Current Facility-Administered Medications  Medication Dose Route Frequency Provider Last Rate Last Admin  . acetaminophen (TYLENOL) tablet 650 mg  650 mg Oral Q6H PRN Lindon Romp A, NP      . alum & mag hydroxide-simeth (MAALOX/MYLANTA) 200-200-20 MG/5ML suspension 30 mL  30 mL Oral Q4H PRN Lindon Romp A, NP      . citalopram (CELEXA) tablet 20 mg  20 mg Oral Daily Sharma Covert, MD   20 mg at 10/08/19 0803  . dutasteride (AVODART) capsule 0.5 mg  0.5 mg Oral Daily Lindon Romp A, NP      . feeding supplement (ENSURE ENLIVE) (ENSURE ENLIVE) liquid 237 mL  237 mL Oral BID BM Sharma Covert, MD   237 mL at 10/08/19 1016  . hydrOXYzine (ATARAX/VISTARIL) tablet 25 mg  25 mg Oral TID PRN Rozetta Nunnery, NP      . magnesium hydroxide (MILK OF MAGNESIA) suspension 30 mL  30 mL Oral Daily PRN Lindon Romp A, NP      . traZODone (DESYREL) tablet 50 mg  50 mg Oral QHS PRN Rozetta Nunnery, NP        Lab Results:  Results for orders placed or performed during the hospital encounter of 10/06/19 (from the past 48 hour(s))  Respiratory Panel by RT PCR (Flu A&B, Covid) - Nasopharyngeal Swab     Status: None   Collection Time: 10/06/19  8:29 PM   Specimen: Nasopharyngeal Swab  Result Value Ref Range   SARS Coronavirus 2 by RT PCR NEGATIVE NEGATIVE    Comment: (NOTE) SARS-CoV-2 target nucleic acids are NOT DETECTED. The SARS-CoV-2 RNA is generally detectable in upper respiratoy specimens during the acute phase of infection. The lowest concentration of SARS-CoV-2 viral copies this assay can detect is 131 copies/mL. A negative result does not preclude SARS-Cov-2 infection and should not be used as the sole basis for treatment or other patient management decisions. A  negative result may occur with  improper specimen collection/handling, submission of specimen other than nasopharyngeal swab, presence of viral mutation(s) within the areas targeted by this assay, and inadequate number of viral copies (<131 copies/mL). A negative result must be combined with clinical observations, patient history, and epidemiological information. The expected result is Negative. Fact Sheet for Patients:  PinkCheek.be Fact Sheet for Healthcare Providers:  GravelBags.it This test is not yet ap proved or cleared by the [place] FDA and  has been authorized for detection and/or diagnosis of SARS-CoV-2 by FDA under an Emergency Use Authorization (EUA). This EUA will remain  in effect (meaning this test can be used) for the duration of the COVID-19 declaration under Section 564(b)(1) of the Act, 21  U.S.C. section 360bbb-3(b)(1), unless the authorization is terminated or revoked sooner.    Influenza A by PCR NEGATIVE NEGATIVE   Influenza B by PCR NEGATIVE NEGATIVE    Comment: (NOTE) The Xpert Xpress SARS-CoV-2/FLU/RSV assay is intended as an aid in  the diagnosis of influenza from Nasopharyngeal swab specimens and  should not be used as a sole basis for treatment. Nasal washings and  aspirates are unacceptable for Xpert Xpress SARS-CoV-2/FLU/RSV  testing. Fact Sheet for Patients: https://www.moore.com/ Fact Sheet for Healthcare Providers: https://www.young.biz/ This test is not yet approved or cleared by the [place] FDA and  has been authorized for detection and/or diagnosis of SARS-CoV-2 by  FDA under an Emergency Use Authorization (EUA). This EUA will remain  in effect (meaning this test can be used) for the duration of the  Covid-19 declaration under Section 564(b)(1) of the Act, 21  U.S.C. section 360bbb-3(b)(1), unless the authorization is  terminated or  revoked. Performed at [facility], [address] [place], [place] [address]   Urine rapid drug screen (hosp performed)not at [facility]     Status: None   Collection Time: 10/06/19 11:01 PM  Result Value Ref Range   Opiates NONE DETECTED NONE DETECTED   Cocaine NONE DETECTED NONE DETECTED   Benzodiazepines NONE DETECTED NONE DETECTED   Amphetamines NONE DETECTED NONE DETECTED   Tetrahydrocannabinol NONE DETECTED NONE DETECTED   Barbiturates NONE DETECTED NONE DETECTED    Comment: (NOTE) DRUG SCREEN FOR MEDICAL PURPOSES ONLY.  IF CONFIRMATION IS NEEDED FOR ANY PURPOSE, NOTIFY LAB WITHIN 5 DAYS. LOWEST DETECTABLE LIMITS FOR URINE DRUG SCREEN Drug Class                     Cutoff (ng/mL) Amphetamine and metabolites    1000 Barbiturate and metabolites    200 Benzodiazepine                 200 Tricyclics and metabolites     300 Opiates and metabolites        300 Cocaine and metabolites        300 THC                            50 Performed at [facility], [place], [address] [place], [place] [address]   CBC     Status: Abnormal   Collection Time: 10/07/19  6:46 AM  Result Value Ref Range   WBC 5.2 4.0 - 10.5 K/uL   RBC 4.22 4.22 - 5.81 MIL/uL   Hemoglobin 12.9 (L) 13.0 - 17.0 g/dL   HCT 00.8 (L) 67.6 - 19.5 %   MCV 90.8 80.0 - 100.0 fL   MCH 30.6 26.0 - 34.0 pg   MCHC 33.7 30.0 - 36.0 g/dL   RDW 09.3 26.7 - 12.4 %   Platelets 278 150 - 400 K/uL   nRBC 0.0 0.0 - 0.2 %    Comment: Performed at [facility], [address] [place], [place] [address]  Comprehensive metabolic panel     Status: Abnormal   Collection Time: 10/07/19  6:46 AM  Result Value Ref Range   Sodium 138 135 - 145 mmol/L   Potassium 3.5 3.5 - 5.1 mmol/L   Chloride 103 98 - 111 mmol/L   CO2 25 22 - 32 mmol/L   Glucose, Bld 123 (H) 70 - 99 mg/dL   BUN 13 6 - 20 mg/dL  Creatinine, Ser 0.72 0.61 - 1.24 mg/dL   Calcium 9.3 8.9 - 64.1 mg/dL   Total  Protein 6.9 6.5 - 8.1 g/dL   Albumin 4.6 3.5 - 5.0 g/dL   AST 35 15 - 41 U/L   ALT 30 0 - 44 U/L   Alkaline Phosphatase 43 38 - 126 U/L   Total Bilirubin 1.0 0.3 - 1.2 mg/dL   GFR calc non Af Amer >60 >60 mL/min   GFR calc Af Amer >60 >60 mL/min   Anion gap 10 5 - 15    Comment: Performed at [facility], [address] [place], [place] [address]  Lipid panel     Status: None   Collection Time: 10/07/19  6:46 AM  Result Value Ref Range   Cholesterol 157 0 - 200 mg/dL   Triglycerides 33 <407 mg/dL   HDL 69 >68 mg/dL   Total CHOL/HDL Ratio 2.3 RATIO   VLDL 7 0 - 40 mg/dL   LDL Cholesterol 81 0 - 99 mg/dL    Comment:        Total Cholesterol/HDL:CHD Risk Coronary Heart Disease Risk Table                     Men   Women  1/2 Average Risk   3.4   3.3  Average Risk       5.0   4.4  2 X Average Risk   9.6   7.1  3 X Average Risk  23.4   11.0        Use the calculated Patient Ratio above and the CHD Risk Table to determine the patient's CHD Risk.        ATP III CLASSIFICATION (LDL):  <100     mg/dL   Optimal  088-110  mg/dL   Near or Above                    Optimal  130-159  mg/dL   Borderline  315-945  mg/dL   High  >859     mg/dL   Very High Performed at [facility], [address], [place], [place] [address]   TSH     Status: None   Collection Time: 10/07/19  6:46 AM  Result Value Ref Range   TSH 1.661 0.350 - 4.500 uIU/mL    Comment: Performed by a 3rd Generation assay with a functional sensitivity of <=0.01 uIU/mL. Performed at [facility], [address], [place], [place] [address]     Blood Alcohol level:  Lab Results  Component Value Date   ETH <10 03/13/2019    Metabolic Disorder Labs: Lab Results  Component Value Date   HGBA1C 4.6 (L) 03/15/2019   MPG 85.32 03/15/2019   No results found for: PROLACTIN Lab Results  Component Value Date   CHOL 157 10/07/2019   TRIG 33 10/07/2019   HDL 69  10/07/2019   CHOLHDL 2.3 10/07/2019   VLDL 7 10/07/2019   LDLCALC 81 10/07/2019   LDLCALC 117 (H) 03/15/2019    Physical Findings: AIMS: Facial and Oral Movements Muscles of Facial Expression: None, normal Lips and Perioral Area: None, normal Jaw: None, normal Tongue: None, normal,Extremity Movements Upper (arms, wrists, hands, fingers): None, normal Lower (legs, knees, ankles, toes): None, normal, Trunk Movements Neck, shoulders, hips: None, normal, Overall Severity Severity of abnormal movements (highest score from questions above): None, normal Incapacitation due to abnormal movements: None, normal Patient's awareness  of abnormal movements (rate only patient's report): No Awareness, Dental Status Current problems with teeth and/or dentures?: No Does patient usually wear dentures?: No  CIWA:  CIWA-Ar Total: 3 COWS:  COWS Total Score: 2  Musculoskeletal: Strength & Muscle Tone: within normal limits Gait & Station: normal Patient leans: N/A  Psychiatric Specialty Exam: Physical Exam  Nursing note and vitals reviewed. Constitutional: He is oriented to person, place, and time. He appears well-developed and well-nourished.  Cardiovascular: Normal rate.  Respiratory: Effort normal.  Neurological: He is alert and oriented to person, place, and time.    Review of Systems  Constitutional: Negative.   Respiratory: Negative for cough and shortness of breath.   Gastrointestinal: Negative for nausea and vomiting.  Neurological: Negative for headaches.  Psychiatric/Behavioral: Positive for suicidal ideas. Negative for agitation, behavioral problems, dysphoric mood, hallucinations, self-injury and sleep disturbance. The patient is nervous/anxious.     Blood pressure 105/73, pulse 78, temperature 98.1 F (36.7 C), resp. rate 18, height 5\' 11"  (1.803 m), weight 77.6 kg, SpO2 99 %.Body mass index is 23.85 kg/m.  General Appearance: Casual  Eye Contact:  Good  Speech:  Normal Rate   Volume:  Normal  Mood:  Anxious  Affect:  Congruent  Thought Process:  Coherent  Orientation:  Full (Time, Place, and Person)  Thought Content:  Logical  Suicidal Thoughts:  Yes.  without intent/plan  Homicidal Thoughts:  No  Memory:  Immediate;   Good Recent;   Good Remote;   Good  Judgement:  Intact  Insight:  Fair  Psychomotor Activity:  Normal  Concentration:  Concentration: Good and Attention Span: Good  Recall:  Good  Fund of Knowledge:  Fair  Language:  Good  Akathisia:  No  Handed:  Right  AIMS (if indicated):     Assets:  Communication Skills Desire for Improvement Housing Resilience Social Support  ADL's:  Intact  Cognition:  WNL  Sleep:  Number of Hours: 6     Treatment Plan Summary: Daily contact with patient to assess and evaluate symptoms and progress in treatment and Medication management   Continue inpatient hospitalization.  Continue Celexa 20 mg PO daily for depression/anxiety Continue Adovart 0.5 mg PO daily for transgender Continue Vistaril 25 mg PO TID PRN anxiety Continue trazodone 50 mg PO QHS PRN insomnia  Patient will participate in the therapeutic group milieu.  Discharge disposition in progress.   , NP 10/08/2019, 10:27 AM

## 2019-10-08 NOTE — Progress Notes (Signed)
Recreation Therapy Notes  Date: 1.18.21 Time: 0930 Location: 300 [address] Dayroom  Group Topic: Stress Management  Goal Area(s) Addresses:  Patient will identify positive stress management techniques. Patient will identify benefits of using stress management post d/c.  Behavioral Response: Engaged  Intervention: Stress Management  Activity: Meditation.  LRT introduced the stress management technique of meditation.  The meditation focused on taking on the characteristics of the mountain (tall and strong).  Patients were to listen and follow along as the meditation played in order to engage in activity.   Education:  Stress Management, Discharge Planning.   Education Outcome: Acknowledges Education  Clinical Observations/Feedback: Pt attended and participated in activity.    Caroll Rancher, LRT/CTRS         Lillia Abed, Tysheem Accardo A 10/08/2019 11:13 AM

## 2019-10-08 NOTE — Plan of Care (Signed)
Nurse discussed anxiety, depression, and coping skills with patient.  

## 2019-10-08 NOTE — BHH Counselor (Addendum)
CSW met with patient on unit to discuss aftercare and referrals.   Patient expressed interest in long-term residential treatment for mental health. CSW discussed a referral to [facility] in [place], patient completed an insurance benefits authorization form, CSW faxed to admissions at [phone].  CSW discussed IOP with patient, if patient is not able to discharge to residential treatment. Patient states they would be agreeable to IOP, preferably in person or a hybrid program instead of virtual.   Patient reports they previously received outpatient therapy by telemedicine through [facility] in [facility]. Patient states they have not received services through [facility] in approximately 3-4 months.  CSW following for disposition.   Update 3:15pm "Katie" from Admissions at [facility] to report that unfortunately patient's insurance is not in-network and would not cover any of the costs of treatment.  Stephanie Acre, MSW, [place] Social Worker [facility] Adult Unit  [phone]

## 2019-10-09 NOTE — Progress Notes (Signed)
[facility] MD Progress Note  10/09/2019 9:27 AM Paul [name]  MRN:  [id number] Subjective: Patient reports some lingering depression and anxiety.  Denies suicidal ideations at this time.  Denies medication side effects so far (currently on Celexa which was recently increased in dose)  Objective: I discussed case with treatment team and met with patient. 29 year old, identifies as transgender male to male.  Presented due to worsening mood and anxiety symptoms, described mainly as OCD type symptoms such as excessive handwashing, checking/rechecking behaviors.  Known to our unit from a prior admission in/2020 at which time presented for depression and OCD symptoms as well. At the time was discharged on Zoloft and trazodone.  Patient states stopped Zoloft soon after discharge due to vague side effects ("felt funny on it").  Was recently started on Celexa (about a week and a half ago).  Today presents alert, attentive, calm, pleasant on approach.  Describes some improvement compared to admission but describes ongoing subjective anxiety and feeling vaguely depressed.  Reports OCD symptoms such as compulsive handwashing and checking/rechecking behaviors prior to admission.  These have tended to improve while in the hospital which attributes to being in different environment from all. Currently denies suicidal ideations.  Contracts for safety on unit. Thus far tolerating Celexa well without side effects (it was recently titrated from 10 mg daily to try milligrams daily).. Labs reviewed-unremarkable.  Of note on June admission LFTs were mildly elevated, they are currently normal. .   Principal Problem: Obsessive compulsive disorder  Diagnosis: Principal Problem:   Obsessive compulsive disorder Active Problems:   Severe recurrent major depression without psychotic features ([place])  Total Time spent with patient: 20 minutes  Past Psychiatric History: See admission H&P  Past Medical History:  Past Medical  History:  Diagnosis Date  . Male-to-male transgender person   . Medical history non-contributory     Past Surgical History:  Procedure Laterality Date  . NO PAST SURGERIES     Family History: History reviewed. No pertinent family history. Family Psychiatric  History: See admission H&P Social History:  Social History   Substance and Sexual Activity  Alcohol Use Not Currently     Social History   Substance and Sexual Activity  Drug Use Never    Social History   Socioeconomic History  . Marital status: Single    Spouse name: Not on file  . Number of children: Not on file  . Years of education: Not on file  . Highest education level: Not on file  Occupational History  . Not on file  Tobacco Use  . Smoking status: Never Smoker  . Smokeless tobacco: Never Used  Substance and Sexual Activity  . Alcohol use: Not Currently  . Drug use: Never  . Sexual activity: Not Currently  Other Topics Concern  . Not on file  Social History Narrative  . Not on file   Social Determinants of Health   Financial Resource Strain:   . Difficulty of Paying Living Expenses: Not on file  Food Insecurity:   . Worried About Charity fundraiser in the Last Year: Not on file  . Ran Out of Food in the Last Year: Not on file  Transportation Needs:   . Lack of Transportation (Medical): Not on file  . Lack of Transportation (Non-Medical): Not on file  Physical Activity:   . Days of Exercise per Week: Not on file  . Minutes of Exercise per Session: Not on file  Stress:   . Feeling of Stress :  Not on file  Social Connections:   . Frequency of Communication with Friends and Family: Not on file  . Frequency of Social Gatherings with Friends and Family: Not on file  . Attends Religious Services: Not on file  . Active Member of Clubs or Organizations: Not on file  . Attends Archivist Meetings: Not on file  . Marital Status: Not on file   Additional Social History:    Pain  Medications: See MAR Prescriptions: See MAR Over the Counter: See MAR History of alcohol / drug use?: Yes Longest period of sobriety (when/how long): unknown                    Sleep: Good  Appetite:  Good  Current Medications: Current Facility-Administered Medications  Medication Dose Route Frequency Provider Last Rate Last Admin  . acetaminophen (TYLENOL) tablet 650 mg  650 mg Oral Q6H PRN Lindon Romp A, NP      . alum & mag hydroxide-simeth (MAALOX/MYLANTA) 200-200-20 MG/5ML suspension 30 mL  30 mL Oral Q4H PRN Lindon Romp A, NP      . citalopram (CELEXA) tablet 20 mg  20 mg Oral Daily Sharma Covert, MD   20 mg at 10/09/19 1561  . dutasteride (AVODART) capsule 0.5 mg  0.5 mg Oral Daily Lindon Romp A, NP      . feeding supplement (ENSURE ENLIVE) (ENSURE ENLIVE) liquid 237 mL  237 mL Oral BID BM Sharma Covert, MD   237 mL at 10/08/19 1600  . hydrOXYzine (ATARAX/VISTARIL) tablet 25 mg  25 mg Oral TID PRN Lindon Romp A, NP      . magnesium hydroxide (MILK OF MAGNESIA) suspension 30 mL  30 mL Oral Daily PRN Lindon Romp A, NP      . traZODone (DESYREL) tablet 50 mg  50 mg Oral QHS PRN Rozetta Nunnery, NP        Lab Results:  No results found for this or any previous visit (from the past 46 hour(s)).  Blood Alcohol level:  Lab Results  Component Value Date   ETH <10 53/79/4327    Metabolic Disorder Labs: Lab Results  Component Value Date   HGBA1C 4.6 (L) 03/15/2019   MPG 85.32 03/15/2019   No results found for: PROLACTIN Lab Results  Component Value Date   CHOL 157 10/07/2019   TRIG 33 10/07/2019   HDL 69 10/07/2019   CHOLHDL 2.3 10/07/2019   VLDL 7 10/07/2019   LDLCALC 81 10/07/2019   LDLCALC 117 (H) 03/15/2019    Physical Findings: AIMS: Facial and Oral Movements Muscles of Facial Expression: None, normal Lips and Perioral Area: None, normal Jaw: None, normal Tongue: None, normal,Extremity Movements Upper (arms, wrists, hands, fingers):  None, normal Lower (legs, knees, ankles, toes): None, normal, Trunk Movements Neck, shoulders, hips: None, normal, Overall Severity Severity of abnormal movements (highest score from questions above): None, normal Incapacitation due to abnormal movements: None, normal Patient's awareness of abnormal movements (rate only patient's report): No Awareness, Dental Status Current problems with teeth and/or dentures?: No Does patient usually wear dentures?: No  CIWA:  CIWA-Ar Total: 3 COWS:  COWS Total Score: 2  Musculoskeletal: Strength & Muscle Tone: within normal limits Gait & Station: normal Patient leans: N/A  Psychiatric Specialty Exam: Physical Exam  Nursing note and vitals reviewed. Constitutional: He is oriented to person, place, and time. He appears well-developed and well-nourished.  Cardiovascular: Normal rate.  Respiratory: Effort normal.  Neurological: He is alert  and oriented to person, place, and time.    Review of Systems  Constitutional: Negative.   Respiratory: Negative for cough and shortness of breath.   Gastrointestinal: Negative for nausea and vomiting.  Neurological: Negative for headaches.  Psychiatric/Behavioral: Positive for suicidal ideas. Negative for agitation, behavioral problems, dysphoric mood, hallucinations, self-injury and sleep disturbance. The patient is nervous/anxious.   Denies headache, no chest pain, no shortness of breath, no nausea, no vomiting  Blood pressure 114/79, pulse 70, temperature 98.1 F (36.7 C), resp. rate 18, height '5\' 11"'  (1.803 m), weight 77.6 kg, SpO2 99 %.Body mass index is 23.85 kg/m.  General Appearance: Casual and Well Groomed  Eye Contact:  Good  Speech:  Normal Rate  Volume:  Normal  Mood:  Describes partial improvement compared to admission, remains vaguely depressed and anxious  Affect:  Congruent, smiles at times appropriately during session  Thought Process:  Linear and Descriptions of Associations: Intact   Orientation:  Full (Time, Place, and Person)  Thought Content:  No hallucinations, no delusions, not internally preoccupied  Suicidal Thoughts:  No currently denies suicidal or self-injurious ideations  Homicidal Thoughts:  No  Memory:  Recent and remote grossly intact  Judgement:  Other:  Improving  Insight:  Fair/improving  Psychomotor Activity:  Normal-no psychomotor agitation or restlessness  Concentration:  Concentration: Good and Attention Span: Good  Recall:  Good  Fund of Knowledge:  Good  Language:  Good  Akathisia:  No  Handed:  Right  AIMS (if indicated):     Assets:  Communication Skills Desire for Improvement Housing Resilience Social Support  ADL's:  Intact  Cognition:  WNL  Sleep:  Number of Hours: 6.5   Assessment: 29 year old, identifies as transgender male to male.  Presented due to worsening mood and anxiety symptoms, described mainly as OCD type symptoms such as excessive handwashing, checking/rechecking behaviors.  Known to our unit from a prior admission in/2020 at which time presented for depression and OCD symptoms as well. At the time was discharged on Zoloft and trazodone.  Patient states stopped Zoloft soon after discharge due to vague side effects ("felt funny on it").  Was recently started on Celexa (about a week and a half ago).  Currently reports some improvement compared to admission but describes lingering depression and anxiety.  Denies SI and contracts for safety at this time.  Thus far tolerating Celexa well.  We discussed augmentation options such as adding low-dose Abilify or Remeron to SSRI trial but at this time prefers to continue monotherapy with citalopram  Treatment Plan Summary: Daily contact with patient to assess and evaluate symptoms and progress in treatment and Medication management  Treatment plan reviewed as below today 1/19 Encourage group and milieu participation Continue Celexa 20 mg PO daily for depression/anxiety Continue  Adovart 0.5 mg PO daily for transgender Continue Vistaril 25 mg PO TID PRN anxiety Continue Trazodone 50 mg PO QHS PRN insomnia Treatment team working on disposition planning options  Jenne Campus, MD 10/09/2019, 9:27 AM   Patient ID: Paul [name], male   DOB: July 15, 1991, 29 y.o.   MRN: [id number]

## 2019-10-09 NOTE — Progress Notes (Signed)
   10/09/19 0056  Psych Admission Type (Psych Patients Only)  Admission Status Voluntary  Psychosocial Assessment  Patient Complaints Anxiety  Eye Contact Fair  Facial Expression Anxious  Affect Anxious  Speech Logical/coherent  Interaction Assertive  Motor Activity Other (Comment) (WNL)  Appearance/Hygiene Unremarkable  Behavior Characteristics Cooperative  Mood Depressed;Anxious  Aggressive Behavior  Effect No apparent injury  Thought Process  Coherency WDL  Content WDL  Delusions None reported or observed  Perception WDL  Hallucination None reported or observed  Judgment Impaired  Confusion None  Danger to Self  Current suicidal ideation? Denies  Agreement Not to Harm Self Yes  Description of Agreement verbal  Danger to Others  Danger to Others None reported or observed   Pt very minimal and apologetic. Pt anxious about returning to "everyday life." Wants a residential mental health program in [place], but doesn't know if there are any. Feels like he needs some time to get better. Pt is transgender and says that family is finding it difficult to adjust. Minimizes he own feelings for that of others. Rates anxiety 4-5/10. Denies SI, HI, AVH or pain. Pt contracts for safety.

## 2019-10-09 NOTE — Progress Notes (Signed)
The patient learned today to have "more peace". He states that he felt bad in the morning, but is now feeling better. His goal for tomorrow is to keep up what he accomplished today and get discharged.

## 2019-10-09 NOTE — Progress Notes (Signed)
Recreation Therapy Notes  Animal-Assisted Activity (AAA) Program Checklist/Progress Notes Patient Eligibility Criteria Checklist & Daily Group note for Rec Tx Intervention  Date: 1.19.21 Time: 1430 Location: [address]   AAA/T Program Assumption of Risk Form signed by Engineer, production or Parent Legal Guardian YES   Patient is free of allergies or sever asthma  YES   Patient reports no fear of animals  YES   Patient reports no history of cruelty to animals YES   Patient understands his/her participation is voluntary YES   Patient washes hands before animal contact  YES   Patient washes hands after animal contact YES  Behavioral Response: Engaged  Education: Charity fundraiser, Appropriate Animal Interaction   Education Outcome: Acknowledges understanding/In group clarification offered/Needs additional education.   Clinical Observations/Feedback:  Patient attended and participated in group activity.    Caroll Rancher, LRT/CTRS         Caroll Rancher A 10/09/2019 3:34 PM

## 2019-10-09 NOTE — Progress Notes (Addendum)
Pt reported that he has anxiety about discharge plans and his after care program.  He said that he is "trying to stay positive" and "pray to God" for strength.  Pt denies AVH/SI/HI.  RN encouraged pt to reach out  for support and if pt would like to talk 1:1 with RN.  Pt stated that he would let staff know if he needs assistance.

## 2019-10-09 NOTE — Progress Notes (Signed)
Nutrition Brief Note  Patient identified on the Malnutrition Screening Tool (MST) Report  Pt with no weight loss per weight records. Pt reporting appetite has improved since admission. Ensures were ordered at admission.  Wt Readings from Last 15 Encounters:  10/07/19 77.6 kg  03/14/19 71.7 kg  03/14/19 71.7 kg    Body mass index is 23.85 kg/m. Patient meets criteria for normal based on current BMI.   Labs and medications reviewed.   No nutrition interventions warranted at this time. If nutrition issues arise, please consult RD.   Tilda Franco, MS, RD, LDN Inpatient Clinical Dietitian Pager: [phone] After Hours Pager: [phone]

## 2019-10-09 NOTE — Progress Notes (Signed)
   10/09/19 0930  Psych Admission Type (Psych Patients Only)  Admission Status Voluntary  Psychosocial Assessment  Patient Complaints Anxiety  Eye Contact Fair  Facial Expression Anxious  Affect Anxious  Speech Logical/coherent  Interaction Assertive  Motor Activity Other (Comment) (WNL)  Appearance/Hygiene Unremarkable  Behavior Characteristics Cooperative  Mood Depressed;Anxious  Aggressive Behavior  Effect No apparent injury  Thought Process  Coherency WDL  Content WDL  Delusions None reported or observed  Perception WDL  Hallucination None reported or observed  Judgment Impaired  Confusion None  Danger to Self  Current suicidal ideation? Denies  Agreement Not to Harm Self Yes  Description of Agreement verbally contracts for safety  Danger to Others  Danger to Others None reported or observed

## 2019-10-09 NOTE — [facility] Suicide Risk Assessment (Addendum)
[facility] INPATIENT:  Family/Significant Other Suicide Prevention Education  Suicide Prevention Education:  Contact Attempts: with mother, Seung Nidiffer [phone]) has been identified by the patient as the family member/significant other with whom the patient will be residing, and identified as the person(s) who will aid the patient in the event of a mental health crisis.  With written consent from the patient, two attempts were made to provide suicide prevention education, prior to and/or following the patient's discharge.  We were unsuccessful in providing suicide prevention education.  A suicide education pamphlet was given to the patient to share with family/significant other.  Date and time of first attempt: 10/09/2019 / 11:21am   Date and time of second attempt: 10/09/2019 / 2:32pm  Paul [name] 10/09/2019, 2:33 PM

## 2019-10-10 MED ORDER — CITALOPRAM HYDROBROMIDE 10 MG PO TABS
30.0000 mg | ORAL_TABLET | Freq: Every day | ORAL | Status: DC
  Administered 2019-10-11 – 2019-10-13 (×3): 30 mg via ORAL
  Filled 2019-10-10 (×5): qty 3

## 2019-10-10 MED ORDER — CITALOPRAM HYDROBROMIDE 10 MG PO TABS
10.0000 mg | ORAL_TABLET | Freq: Once | ORAL | Status: AC
  Administered 2019-10-10: 10 mg via ORAL
  Filled 2019-10-10 (×2): qty 1

## 2019-10-10 NOTE — Progress Notes (Signed)
    10/10/19 0900  Psych Admission Type (Psych Patients Only)  Admission Status Voluntary  Psychosocial Assessment  Patient Complaints None  Eye Contact Fair  Facial Expression Flat  Affect Appropriate to circumstance  Speech Logical/coherent  Interaction Minimal  Motor Activity Slow  Appearance/Hygiene Unremarkable  Behavior Characteristics Cooperative  Mood Anxious  Thought Process  Coherency WDL  Content WDL  Delusions WDL  Perception WDL  Hallucination None reported or observed  Judgment WDL  Confusion WDL  Danger to Self  Current suicidal ideation? Denies  Danger to Others  Danger to Others None reported or observed

## 2019-10-10 NOTE — Progress Notes (Signed)
Pt asked that the doctor in charge of his case be notified that he would like to discharge today. Pt informed that the doctor will make the final determination for that but this writer said that a note would be entered into the chart.

## 2019-10-10 NOTE — Progress Notes (Addendum)
[facility] MD Progress Note  10/10/2019 10:49 AM Paul [name]  MRN:  [id number] Subjective:  "I am depressed. Life has taken a turn for the worse."  Mr. Paul [name] found cleaning in his room. He is tearful and anxious today. He reports severe OCD symptoms. He started cleaning the water out of the sink this morning after washing his hands, and then had to clean the soap. He touched his mask and had to wash his hands again. He has been cleaning his door, doorknobs and rearranging items in his room as well. He is tearful about his inability to stop repetitive cleaning/checking behaviors and states, "I don't know what I'm going to do when I get home and have to deal with the mess from my mom's dogs." He expresses regret and feelings of failure related to his inability to maintain jobs related to his OCD and having to live with his mother. He feels "out of control" with the OCD and frustrated with the time and energy required for his compulsive cleaning/checking. He admits to passive SI but denies plan or intent. He denies medication side effects. He admits that part of the reason for weight loss prior to admission was that he had been denying himself meals as a punishment for "bad behaviors" such as saying the wrong thing or looking at other people the wrong way. He denies restricting being related to concerns with body image. We reviewed mental and physical health risks with food deprivation/weight loss and encouraged patient to reward self for completing small tasks, rather than punishing himself. He has been eating and sleeping well in the hospital. Support and encouragement provided. He had previously requested discharge today but is agreeable to staying another day for medication titration due to severity of depression and OCD symptoms.  From admission H&P: Patient is a 29 year old transgender male to male who presented to the behavioral health hospital yesterday as a walk-in evaluation. Patient stated that his mood  and anxiety symptoms were worsening, and that hisobsessive-compulsive disorder symptoms were poorly controlled.   Principal Problem: Obsessive compulsive disorder Diagnosis: Principal Problem:   Obsessive compulsive disorder Active Problems:   Severe recurrent major depression without psychotic features (HCC)  Total Time spent with patient: 30 minutes  Past Psychiatric History: See admission H&P  Past Medical History:  Past Medical History:  Diagnosis Date  . Male-to-male transgender person   . Medical history non-contributory     Past Surgical History:  Procedure Laterality Date  . NO PAST SURGERIES     Family History: History reviewed. No pertinent family history. Family Psychiatric  History: See admission H&P Social History:  Social History   Substance and Sexual Activity  Alcohol Use Not Currently     Social History   Substance and Sexual Activity  Drug Use Never    Social History   Socioeconomic History  . Marital status: Single    Spouse name: Not on file  . Number of children: Not on file  . Years of education: Not on file  . Highest education level: Not on file  Occupational History  . Not on file  Tobacco Use  . Smoking status: Never Smoker  . Smokeless tobacco: Never Used  Substance and Sexual Activity  . Alcohol use: Not Currently  . Drug use: Never  . Sexual activity: Not Currently  Other Topics Concern  . Not on file  Social History Narrative  . Not on file   Social Determinants of Health   Financial Resource Strain:   .  Difficulty of Paying Living Expenses: Not on file  Food Insecurity:   . Worried About Charity fundraiser in the Last Year: Not on file  . Ran Out of Food in the Last Year: Not on file  Transportation Needs:   . Lack of Transportation (Medical): Not on file  . Lack of Transportation (Non-Medical): Not on file  Physical Activity:   . Days of Exercise per Week: Not on file  . Minutes of Exercise per Session: Not on  file  Stress:   . Feeling of Stress : Not on file  Social Connections:   . Frequency of Communication with Friends and Family: Not on file  . Frequency of Social Gatherings with Friends and Family: Not on file  . Attends Religious Services: Not on file  . Active Member of Clubs or Organizations: Not on file  . Attends Archivist Meetings: Not on file  . Marital Status: Not on file   Additional Social History:  Specify valuables returned: see belonging sheet Pain Medications: See MAR Prescriptions: See MAR Over the Counter: See MAR History of alcohol / drug use?: Yes Longest period of sobriety (when/how long): unknown                    Sleep: Good  Appetite:  Good  Current Medications: Current Facility-Administered Medications  Medication Dose Route Frequency Provider Last Rate Last Admin  . acetaminophen (TYLENOL) tablet 650 mg  650 mg Oral Q6H PRN Rozetta Nunnery, NP      . alum & mag hydroxide-simeth (MAALOX/MYLANTA) 200-200-20 MG/5ML suspension 30 mL  30 mL Oral Q4H PRN Rozetta Nunnery, NP      . Derrill Memo ON 10/11/2019] citalopram (CELEXA) tablet 30 mg  30 mg Oral Daily Harriett Sine E, NP      . dutasteride (AVODART) capsule 0.5 mg  0.5 mg Oral Daily Lindon Romp A, NP      . feeding supplement (ENSURE ENLIVE) (ENSURE ENLIVE) liquid 237 mL  237 mL Oral BID BM Sharma Covert, MD   237 mL at 10/10/19 0950  . hydrOXYzine (ATARAX/VISTARIL) tablet 25 mg  25 mg Oral TID PRN Lindon Romp A, NP      . magnesium hydroxide (MILK OF MAGNESIA) suspension 30 mL  30 mL Oral Daily PRN Lindon Romp A, NP      . traZODone (DESYREL) tablet 50 mg  50 mg Oral QHS PRN Rozetta Nunnery, NP        Lab Results: No results found for this or any previous visit (from the past 57 hour(s)).  Blood Alcohol level:  Lab Results  Component Value Date   ETH <10 75/64/3329    Metabolic Disorder Labs: Lab Results  Component Value Date   HGBA1C 4.6 (L) 03/15/2019   MPG 85.32 03/15/2019    No results found for: PROLACTIN Lab Results  Component Value Date   CHOL 157 10/07/2019   TRIG 33 10/07/2019   HDL 69 10/07/2019   CHOLHDL 2.3 10/07/2019   VLDL 7 10/07/2019   LDLCALC 81 10/07/2019   LDLCALC 117 (H) 03/15/2019    Physical Findings: AIMS: Facial and Oral Movements Muscles of Facial Expression: None, normal Lips and Perioral Area: None, normal Jaw: None, normal Tongue: None, normal,Extremity Movements Upper (arms, wrists, hands, fingers): None, normal Lower (legs, knees, ankles, toes): None, normal, Trunk Movements Neck, shoulders, hips: None, normal, Overall Severity Severity of abnormal movements (highest score from questions above): None, normal Incapacitation due  to abnormal movements: None, normal Patient's awareness of abnormal movements (rate only patient's report): No Awareness, Dental Status Current problems with teeth and/or dentures?: No Does patient usually wear dentures?: No  CIWA:  CIWA-Ar Total: 3 COWS:  COWS Total Score: 2  Musculoskeletal: Strength & Muscle Tone: within normal limits Gait & Station: normal Patient leans: N/A  Psychiatric Specialty Exam: Physical Exam  Nursing note and vitals reviewed. Constitutional: He is oriented to person, place, and time. He appears well-developed and well-nourished.  Cardiovascular: Normal rate.  Respiratory: Effort normal.  Musculoskeletal:        General: Normal range of motion.  Neurological: He is alert and oriented to person, place, and time.    Review of Systems  Constitutional: Negative.   Respiratory: Negative for cough and shortness of breath.   Gastrointestinal: Negative for diarrhea, nausea and vomiting.  Musculoskeletal: Negative for arthralgias.  Neurological: Negative for light-headedness and headaches.  Psychiatric/Behavioral: Positive for dysphoric mood and suicidal ideas (passive). Negative for agitation, behavioral problems, hallucinations, self-injury and sleep disturbance.  The patient is nervous/anxious. The patient is not hyperactive.     Blood pressure 119/84, pulse 75, temperature 97.9 F (36.6 C), resp. rate 16, height 5\' 11"  (1.803 m), weight 77.6 kg, SpO2 99 %.Body mass index is 23.85 kg/m.  General Appearance: Fairly Groomed  Eye Contact:  Good  Speech:  Normal Rate  Volume:  Normal  Mood:  Anxious and Depressed  Affect:  Congruent and Tearful  Thought Process:  Coherent  Orientation:  Full (Time, Place, and Person)  Thought Content:  Rumination  Suicidal Thoughts:  Yes.  without intent/plan  Homicidal Thoughts:  No  Memory:  Immediate;   Good Recent;   Good Remote;   Good  Judgement:  Fair  Insight:  Fair  Psychomotor Activity:  Normal  Concentration:  Concentration: Good and Attention Span: Good  Recall:  Good  Fund of Knowledge:  Fair  Language:  Good  Akathisia:  No  Handed:  Right  AIMS (if indicated):     Assets:  Communication Skills Desire for Improvement Housing Resilience Social Support  ADL's:  Intact  Cognition:  WNL  Sleep:  Number of Hours: 6.75     Treatment Plan Summary: Daily contact with patient to assess and evaluate symptoms and progress in treatment and Medication management   Continue inpatient hospitalization.  Increase Celexa to 30 mg PO daily for depression/anxiety Continue Adovart 0.5 mg PO daily for transgender Continue Vistaril 25 mg PO TID PRN anxiety Continue trazodone 50 mg PO QHS PRN insomnia  Patient will participate in the therapeutic group milieu.  Discharge disposition in progress.   , NP 10/10/2019, 10:49 AM   Agree to NP Note

## 2019-10-10 NOTE — Progress Notes (Signed)
   10/09/19 2349  Psych Admission Type (Psych Patients Only)  Admission Status Voluntary  Psychosocial Assessment  Patient Complaints None  Eye Contact Fair  Facial Expression Flat  Affect Appropriate to circumstance  Speech Logical/coherent  Interaction Minimal  Behavior Characteristics Cooperative;Appropriate to situation  Mood Anxious  Thought Process  Coherency WDL  Content WDL  Delusions WDL  Perception WDL  Hallucination None reported or observed  Judgment WDL  Confusion WDL  Danger to Self  Current suicidal ideation? Denies  Danger to Others  Danger to Others None reported or observed  Patient pleasant on approach tonight. Went to bed early. Reports mood is better and denies any SI at this time.

## 2019-10-10 NOTE — Progress Notes (Signed)
Recreation Therapy Notes  Date:  1.20.21 Time: 0930 Location: [address] Dayroom  Group Topic: Stress Management  Goal Area(s) Addresses:  Patient will identify positive stress management techniques. Patient will identify benefits of using stress management post d/c.  Intervention: Stress Management  Activity :  Progressive Muscle Relaxation.  LRT introduced the stress management technique of progressive muscle relaxation.  LRT read a script that guided patients through each individual muscle group to tense them and then relax them.  Patients were to listen to follow along as LRT read script.  Education:  Stress Management, Discharge Planning.   Education Outcome: Acknowledges Education  Clinical Observations/Feedback: Pt did not attend group activity.    Lovina Zuver, LRT/CTRS         Horace Wishon A 10/10/2019 10:33 AM 

## 2019-10-11 NOTE — Progress Notes (Signed)
   10/11/19 2055  Psych Admission Type (Psych Patients Only)  Admission Status Voluntary  Psychosocial Assessment  Patient Complaints Anxiety;Depression  Eye Contact Fair  Facial Expression Anxious;Sullen;Worried  Affect Anxious;Depressed  Surveyor, minerals Activity Slow  Appearance/Hygiene In scrubs;Unremarkable  Behavior Characteristics Cooperative;Anxious  Thought Process  Coherency WDL  Content WDL  Delusions None reported or observed  Hallucination None reported or observed  Judgment WDL  Confusion None  Danger to Self  Current suicidal ideation? Denies  Agreement Not to Harm Self Yes  Description of Agreement verbally contracts for safety  Danger to Others  Danger to Others None reported or observed   Pt anxious and worried today about going home. Afraid his OCD will be a problem for his mother. Pt shared that he wants to live alone to avoid the stress that living with others has on his OCD. Pt has family support if he needs somewhere to stay but wants his own place and a job. Pt denies SI, HI, AVH and pain. Depression 5/10 and anxiety 3/10. Pt contracts for safety.

## 2019-10-11 NOTE — Progress Notes (Signed)
   10/10/19 2359  Psych Admission Type (Psych Patients Only)  Admission Status Voluntary  Psychosocial Assessment  Patient Complaints Anxiety;Depression  Eye Contact Fair  Facial Expression Anxious  Affect Anxious;Depressed  Speech Logical/coherent  Interaction Minimal  Motor Activity Slow  Appearance/Hygiene Unremarkable;In scrubs  Behavior Characteristics Cooperative;Anxious  Mood Depressed;Anxious  Aggressive Behavior  Effect No apparent injury  Thought Process  Coherency WDL  Content WDL  Delusions WDL  Perception WDL  Hallucination None reported or observed  Judgment WDL  Confusion None  Danger to Self  Current suicidal ideation? Denies  Self-Injurious Behavior No self-injurious ideation or behavior indicators observed or expressed   Agreement Not to Harm Self Yes  Description of Agreement verbally contracts for safety  Danger to Others  Danger to Others None reported or observed   Pt in hall. Discussed with him how he was feeling today. Pt ranked depression 2/10 and anxiety 5/10, both down from earlier today. Pt anxious about leaving. Will stay with mother. Feels mother doesn't understand his transgender lifestyle. OCD was worse today. Says reading the Bible and praying helps him. Pt denies SI, HI, AVH and pain. Pt contracts for safety.

## 2019-10-11 NOTE — Progress Notes (Signed)
Progress note  D: pt found in bed; compliant with medication administration. Pt states they slept well. Pt rates their depression/hopelessness/anxiety 0/0/2 out of 10 respectively. Pt denies any physical complaints or pain. Pt is anxious and guarded on approach. Pt is assertive though. Pt states their goal for today is to continue to stay positive and thankful. Pt states they are worried they may be able to back to the mothers house. Pt also wants to work on their OCD. Pt denies si/hi/ah/vh and verbally agrees to approach staff if these become apparent or before harming themself/others while at [facility].  A: Pt provided support and encouragement. Pt given medication per protocol and standing orders. Q61m safety checks implemented and continued.  R: Pt safe on the unit. Will continue to monitor.

## 2019-10-11 NOTE — [facility] Suicide Risk Assessment (Signed)
[facility] INPATIENT:  Family/Significant Other Suicide Prevention Education  Suicide Prevention Education:  Education Completed; with patient's mother,Terri Langhorst [phone]) has been identified by the patient as the family member/significant other with whom the patient will be residing, and identified as the person(s) who will aid the patient in the event of a mental health crisis (suicidal ideations/suicide attempt).  With written consent from the patient, the family member/significant other has been provided the following suicide prevention education, prior to the and/or following the discharge of the patient.  The suicide prevention education provided includes the following:  Suicide risk factors  Suicide prevention and interventions  National Suicide Hotline telephone number  [facility] assessment telephone number  [facility] Emergency Assistance 911  [facility] and/or Residential Mobile Crisis Unit telephone number  Request made of family/significant other to:  Remove weapons (e.g., guns, rifles, knives), all items previously/currently identified as safety concern.    Remove drugs/medications (over-the-counter, prescriptions, illicit drugs), all items previously/currently identified as a safety concern.  The family member/significant other verbalizes understanding of the suicide prevention education information provided.  The family member/significant other agrees to remove the items of safety concern listed above.  Terri reports she does not think the patient is ready for discharge. She states that the patient has a history of being non-compliant with medications due to side effects. Terri stated she was hopeful the patient would go to a residential mental health program. CSW explained that the patient's insurance is not in network with available programs at this time.   CSW explained that the patient was being referred to an outpatient psychiatrist and  therapist at [facility], in addition to resources for partial and intensive outpatient programming. CSW also explained that the patient did not want to commit to any PHP/IOP at this time. Camelia Eng states that she does not have any additional questions or concerns at this time.   Terri expressed understanding and thanked CSW for the phone call. CSW will continue to follow.   Maeola Sarah 10/11/2019, 12:08 PM

## 2019-10-11 NOTE — Progress Notes (Signed)
[facility] MD Progress Note  10/11/2019 11:49 AM Paul [name]  MRN:  [id number] Subjective: Reports some improvement compared to admission but describes significant anxiety, and worries that OCD type symptoms will exacerbate once she is discharged/returned home.  Explains, for example, that one of her tasks at home is take care of pets dogs and that walking them/picking up after them because a significant obsessive thoughts and compulsive behaviors such as repeated handwashing. Denies suicidal ideations. Denies medication side effects thus far.  Objective: I discussed case with treatment team and have met with patient  29 year old, identifies as transgender male to male.  Presented due to worsening mood and anxiety symptoms, described mainly as OCD type symptoms such as excessive handwashing, checking/rechecking behaviors.  Known to our unit from a prior admission in/2020 at which time presented for depression and OCD symptoms as well. At the time was discharged on Zoloft and trazodone.  Patient states stopped Zoloft soon after discharge due to vague side effects ("felt funny on it").  Was recently started on Celexa (about a week and a half ago).  Currently describes some improvement compared to admission, but endorses persistent/chronic anxiety and OCD symptoms with worry that these might exacerbate again once she is discharged back home.  In addition to anxiety/OCD symptoms is also describes some depression but currently describes mood as improving.  Denies suicidal ideations. Reports being spiritual and tends to look at current psychiatric issues as a challenge or tribulation set by God to help self growth and improvement.  No disruptive or agitated behaviors on unit, pleasant on approach. Denies medication side effects  principal Problem: Obsessive compulsive disorder Diagnosis: Principal Problem:   Obsessive compulsive disorder Active Problems:   Severe recurrent major depression without  psychotic features ([place])  Total Time spent with patient: 20 minutes  Past Psychiatric History: See admission H&P  Past Medical History:  Past Medical History:  Diagnosis Date  . Male-to-male transgender person   . Medical history non-contributory     Past Surgical History:  Procedure Laterality Date  . NO PAST SURGERIES     Family History: History reviewed. No pertinent family history. Family Psychiatric  History: See admission H&P Social History:  Social History   Substance and Sexual Activity  Alcohol Use Not Currently     Social History   Substance and Sexual Activity  Drug Use Never    Social History   Socioeconomic History  . Marital status: Single    Spouse name: Not on file  . Number of children: Not on file  . Years of education: Not on file  . Highest education level: Not on file  Occupational History  . Not on file  Tobacco Use  . Smoking status: Never Smoker  . Smokeless tobacco: Never Used  Substance and Sexual Activity  . Alcohol use: Not Currently  . Drug use: Never  . Sexual activity: Not Currently  Other Topics Concern  . Not on file  Social History Narrative  . Not on file   Social Determinants of Health   Financial Resource Strain:   . Difficulty of Paying Living Expenses: Not on file  Food Insecurity:   . Worried About Charity fundraiser in the Last Year: Not on file  . Ran Out of Food in the Last Year: Not on file  Transportation Needs:   . Lack of Transportation (Medical): Not on file  . Lack of Transportation (Non-Medical): Not on file  Physical Activity:   . Days of Exercise per  Week: Not on file  . Minutes of Exercise per Session: Not on file  Stress:   . Feeling of Stress : Not on file  Social Connections:   . Frequency of Communication with Friends and Family: Not on file  . Frequency of Social Gatherings with Friends and Family: Not on file  . Attends Religious Services: Not on file  . Active Member of Clubs or  Organizations: Not on file  . Attends Archivist Meetings: Not on file  . Marital Status: Not on file   Additional Social History:  Specify valuables returned: see belonging sheet Pain Medications: See MAR Prescriptions: See MAR Over the Counter: See MAR History of alcohol / drug use?: Yes Longest period of sobriety (when/how long): unknown  Sleep: Good  Appetite:  Good  Current Medications: Current Facility-Administered Medications  Medication Dose Route Frequency Provider Last Rate Last Admin  . acetaminophen (TYLENOL) tablet 650 mg  650 mg Oral Q6H PRN Lindon Romp A, NP      . alum & mag hydroxide-simeth (MAALOX/MYLANTA) 200-200-20 MG/5ML suspension 30 mL  30 mL Oral Q4H PRN Lindon Romp A, NP      . citalopram (CELEXA) tablet 30 mg  30 mg Oral Daily Connye Burkitt, NP   30 mg at 10/11/19 0746  . dutasteride (AVODART) capsule 0.5 mg  0.5 mg Oral Daily Lindon Romp A, NP   0.5 mg at 10/11/19 0746  . feeding supplement (ENSURE ENLIVE) (ENSURE ENLIVE) liquid 237 mL  237 mL Oral BID BM Sharma Covert, MD   237 mL at 10/11/19 0747  . hydrOXYzine (ATARAX/VISTARIL) tablet 25 mg  25 mg Oral TID PRN Rozetta Nunnery, NP      . magnesium hydroxide (MILK OF MAGNESIA) suspension 30 mL  30 mL Oral Daily PRN Lindon Romp A, NP      . traZODone (DESYREL) tablet 50 mg  50 mg Oral QHS PRN Rozetta Nunnery, NP        Lab Results: No results found for this or any previous visit (from the past 48 hour(s)).  Blood Alcohol level:  Lab Results  Component Value Date   ETH <10 57/09/7791    Metabolic Disorder Labs: Lab Results  Component Value Date   HGBA1C 4.6 (L) 03/15/2019   MPG 85.32 03/15/2019   No results found for: PROLACTIN Lab Results  Component Value Date   CHOL 157 10/07/2019   TRIG 33 10/07/2019   HDL 69 10/07/2019   CHOLHDL 2.3 10/07/2019   VLDL 7 10/07/2019   LDLCALC 81 10/07/2019   LDLCALC 117 (H) 03/15/2019    Physical Findings: AIMS: Facial and Oral  Movements Muscles of Facial Expression: None, normal Lips and Perioral Area: None, normal Jaw: None, normal Tongue: None, normal,Extremity Movements Upper (arms, wrists, hands, fingers): None, normal Lower (legs, knees, ankles, toes): None, normal, Trunk Movements Neck, shoulders, hips: None, normal, Overall Severity Severity of abnormal movements (highest score from questions above): None, normal Incapacitation due to abnormal movements: None, normal Patient's awareness of abnormal movements (rate only patient's report): No Awareness, Dental Status Current problems with teeth and/or dentures?: No Does patient usually wear dentures?: No  CIWA:  CIWA-Ar Total: 3 COWS:  COWS Total Score: 2  Musculoskeletal: Strength & Muscle Tone: within normal limits Gait & Station: normal Patient leans: N/A  Psychiatric Specialty Exam: Physical Exam  Nursing note and vitals reviewed. Constitutional: He is oriented to person, place, and time. He appears well-developed and well-nourished.  Cardiovascular:  Normal rate.  Respiratory: Effort normal.  Musculoskeletal:        General: Normal range of motion.  Neurological: He is alert and oriented to person, place, and time.    Review of Systems  Constitutional: Negative.   Respiratory: Negative for cough and shortness of breath.   Gastrointestinal: Negative for diarrhea, nausea and vomiting.  Musculoskeletal: Negative for arthralgias.  Neurological: Negative for light-headedness and headaches.  Psychiatric/Behavioral: Positive for dysphoric mood and suicidal ideas (passive). Negative for agitation, behavioral problems, hallucinations, self-injury and sleep disturbance. The patient is nervous/anxious. The patient is not hyperactive.     Blood pressure 114/80, pulse 67, temperature 97.9 F (36.6 C), resp. rate 16, height '5\' 11"'  (1.803 m), weight 77.6 kg, SpO2 99 %.Body mass index is 23.85 kg/m.  General Appearance: Improving grooming  Eye Contact:   Good  Speech:  Normal Rate  Volume:  Normal  Mood:  Remains anxious, presents less depressed  Affect:  Congruent  Thought Process:  Linear and Descriptions of Associations: Intact  Orientation:  Full (Time, Place, and Person)  Thought Content:  Obsessive ideations, no hallucinations, no delusions  Suicidal Thoughts:  No currently denies suicidal plan or intention and contracts for safety on unit  Homicidal Thoughts:  No  Memory:  Recent and remote grossly intact  Judgement: Improving  Insight:  Fair/improving  Psychomotor Activity:  Normal-no psychomotor agitation  Concentration:  Concentration: Good and Attention Span: Good  Recall:  Good  Fund of Knowledge:  Good  Language:  Good  Akathisia:  No  Handed:  Right  AIMS (if indicated):     Assets:  Communication Skills Desire for Improvement Housing Resilience Social Support  ADL's:  Intact  Cognition:  WNL  Sleep:  Number of Hours: 6.75   Assessment:  29 year old, identifies as transgender male to male.  Presented due to worsening mood and anxiety symptoms, described mainly as OCD type symptoms such as excessive handwashing, checking/rechecking behaviors.  Known to our unit from a prior admission in/2020 at which time presented for depression and OCD symptoms as well. At the time was discharged on Zoloft and trazodone.  Patient states stopped Zoloft soon after discharge due to vague side effects ("felt funny on it").  Was recently started on Celexa (about a week and a half ago).  Currently patient is reporting some improvement but describes chronic OCD/anxiety symptoms which have improved partially in hospital setting but which she thinks might exacerbate once more after discharge.  Acknowledges that anxiety and chronicity of symptoms have contributed to depression but states that is feeling better today and denies suicidal ideations.  Thus far tolerating Celexa trial well without side effects.  We discussed augmentation  strategies such as adding a low-dose antipsychotic or another antidepressant current regimen.  Patient prefers to continue monotherapy with Celexa at this time  Treatment Plan Summary: Daily contact with patient to assess and evaluate symptoms and progress in treatment and Medication management  Treatment plan reviewed as below today 1/21 Treatment team working on disposition planning options Continue Celexa 30 mg PO daily for depression/anxiety Continue Adovart 0.5 mg PO daily  Continue Vistaril 25 mg PO TID PRN anxiety Continue Trazodone 50 mg PO QHS PRN insomnia Check EKG to monitor QTc interval  Jenne Campus, MD 10/11/2019, 11:49 AM   Patient ID: Candice Camp, male   DOB: April 15, 1991, 29 y.o.   MRN: [id number]

## 2019-10-12 MED ORDER — CITALOPRAM HYDROBROMIDE 10 MG PO TABS: 30.0000 mg | ORAL_TABLET | Freq: Every day | ORAL | 0 refills | Status: AC

## 2019-10-12 MED ORDER — HYDROXYZINE HCL 25 MG PO TABS: 25.0000 mg | ORAL_TABLET | Freq: Three times a day (TID) | ORAL | 0 refills | Status: AC | PRN

## 2019-10-12 MED ORDER — CITALOPRAM HYDROBROMIDE 10 MG PO TABS: 30.0000 mg | ORAL_TABLET | Freq: Every day | ORAL | 0 refills | Status: DC

## 2019-10-12 MED ORDER — TRAZODONE HCL 50 MG PO TABS: 50.0000 mg | ORAL_TABLET | Freq: Every evening | ORAL | 0 refills | Status: AC | PRN

## 2019-10-12 NOTE — Progress Notes (Signed)
   10/12/19 2045  COVID-19 Daily Checkoff  Have you had a fever (temp > 37.80C/100F)  in the past 24 hours?  No  If you have had runny nose, nasal congestion, sneezing in the past 24 hours, has it worsened? No  COVID-19 EXPOSURE  Have you traveled outside the state in the past 14 days? No  Have you been in contact with someone with a confirmed diagnosis of COVID-19 or PUI in the past 14 days without wearing appropriate PPE? No  Have you been living in the same home as a person with confirmed diagnosis of COVID-19 or a PUI (household contact)? No  Have you been diagnosed with COVID-19? No

## 2019-10-12 NOTE — Tx Team (Signed)
Interdisciplinary Treatment and Diagnostic Plan Update  10/12/2019 Time of Session: 9:00am Paul [name] MRN: [id number]  Principal Diagnosis: Obsessive compulsive disorder  Secondary Diagnoses: Principal Problem:   Obsessive compulsive disorder Active Problems:   Severe recurrent major depression without psychotic features ([facility])   Current Medications:  Current Facility-Administered Medications  Medication Dose Route Frequency Provider Last Rate Last Admin  . acetaminophen (TYLENOL) tablet 650 mg  650 mg Oral Q6H PRN Nira Conn A, NP      . alum & mag hydroxide-simeth (MAALOX/MYLANTA) 200-200-20 MG/5ML suspension 30 mL  30 mL Oral Q4H PRN Nira Conn A, NP      . citalopram (CELEXA) tablet 30 mg  30 mg Oral Daily Aldean Baker, NP   30 mg at 10/12/19 7829  . dutasteride (AVODART) capsule 0.5 mg  0.5 mg Oral Daily Nira Conn A, NP   0.5 mg at 10/11/19 0746  . feeding supplement (ENSURE ENLIVE) (ENSURE ENLIVE) liquid 237 mL  237 mL Oral BID BM Antonieta Pert, MD   237 mL at 10/11/19 1600  . hydrOXYzine (ATARAX/VISTARIL) tablet 25 mg  25 mg Oral TID PRN Nira Conn A, NP      . magnesium hydroxide (MILK OF MAGNESIA) suspension 30 mL  30 mL Oral Daily PRN Nira Conn A, NP      . traZODone (DESYREL) tablet 50 mg  50 mg Oral QHS PRN Jackelyn Poling, NP       PTA Medications: Medications Prior to Admission  Medication Sig Dispense Refill Last Dose  . acetaminophen (TYLENOL) 325 MG tablet Take 650 mg by mouth every 6 (six) hours as needed for mild pain or headache.     . amphetamine-dextroamphetamine (ADDERALL) 10 MG tablet Take 10 mg by mouth daily with breakfast.     . citalopram (CELEXA) 10 MG tablet Take 10 mg by mouth daily.     Marland Kitchen ibuprofen (ADVIL) 400 MG tablet Take 400 mg by mouth every 6 (six) hours as needed for headache or mild pain.     Marland Kitchen dutasteride (AVODART) 0.5 MG capsule Take 0.5 mg by mouth daily.     Marland Kitchen ESTRADIOL VALERATE IM Inject 0.5 mLs into the muscle every  Saturday. 100mg /28mL Concentration     . sertraline (ZOLOFT) 25 MG tablet Take 3 tablets (75 mg total) by mouth daily. (Patient not taking: Reported on 10/07/2019) 90 tablet 0 Not Taking at Unknown time  . [DISCONTINUED] hydrOXYzine (ATARAX/VISTARIL) 25 MG tablet Take 1 tablet (25 mg total) by mouth 3 (three) times daily as needed for anxiety. 30 tablet 0   . [DISCONTINUED] traZODone (DESYREL) 50 MG tablet Take 1 tablet (50 mg total) by mouth at bedtime as needed for sleep. (Patient not taking: Reported on 10/07/2019) 30 tablet 0 Not Taking at Unknown time    Patient Stressors: Financial difficulties Marital or family conflict Occupational concerns  Patient Strengths: Average or above average intelligence Motivation for treatment/growth Supportive family/friends  Treatment Modalities: Medication Management, Group therapy, Case management,  1 to 1 session with clinician, Psychoeducation, Recreational therapy.   Physician Treatment Plan for Primary Diagnosis: Obsessive compulsive disorder Long Term Goal(s): Improvement in symptoms so as ready for discharge Improvement in symptoms so as ready for discharge   Short Term Goals: Ability to verbalize feelings will improve Ability to disclose and discuss suicidal ideas Ability to demonstrate self-control will improve Ability to identify and develop effective coping behaviors will improve Ability to maintain clinical measurements within normal limits will improve Compliance with  prescribed medications will improve  Medication Management: Evaluate patient's response, side effects, and tolerance of medication regimen.  Therapeutic Interventions: 1 to 1 sessions, Unit Group sessions and Medication administration.  Evaluation of Outcomes: Progressing  Physician Treatment Plan for Secondary Diagnosis: Principal Problem:   Obsessive compulsive disorder Active Problems:   Severe recurrent major depression without psychotic features ([facility])  Long  Term Goal(s): Improvement in symptoms so as ready for discharge Improvement in symptoms so as ready for discharge   Short Term Goals: Ability to verbalize feelings will improve Ability to disclose and discuss suicidal ideas Ability to demonstrate self-control will improve Ability to identify and develop effective coping behaviors will improve Ability to maintain clinical measurements within normal limits will improve Compliance with prescribed medications will improve     Medication Management: Evaluate patient's response, side effects, and tolerance of medication regimen.  Therapeutic Interventions: 1 to 1 sessions, Unit Group sessions and Medication administration.  Evaluation of Outcomes: Progressing   RN Treatment Plan for Primary Diagnosis: Obsessive compulsive disorder Long Term Goal(s): Knowledge of disease and therapeutic regimen to maintain health will improve  Short Term Goals: Ability to verbalize feelings will improve, Ability to disclose and discuss suicidal ideas and Compliance with prescribed medications will improve  Medication Management: RN will administer medications as ordered by provider, will assess and evaluate patient's response and provide education to patient for prescribed medication. RN will report any adverse and/or side effects to prescribing provider.  Therapeutic Interventions: 1 on 1 counseling sessions, Psychoeducation, Medication administration, Evaluate responses to treatment, Monitor vital signs and CBGs as ordered, Perform/monitor CIWA, COWS, AIMS and Fall Risk screenings as ordered, Perform wound care treatments as ordered.  Evaluation of Outcomes: Progressing   LCSW Treatment Plan for Primary Diagnosis: Obsessive compulsive disorder Long Term Goal(s): Safe transition to appropriate next level of care at discharge, Engage patient in therapeutic group addressing interpersonal concerns.  Short Term Goals: Engage patient in aftercare planning with  referrals and resources, Increase social support, Increase emotional regulation, Identify triggers associated with mental health/substance abuse issues and Increase skills for wellness and recovery  Therapeutic Interventions: Assess for all discharge needs, 1 to 1 time with Social worker, Explore available resources and support systems, Assess for adequacy in community support network, Educate family and significant other(s) on suicide prevention, Complete Psychosocial Assessment, Interpersonal group therapy.  Evaluation of Outcomes: Progressing  Progress in Treatment: Attending groups: Yes. Participating in groups: Yes. Taking medication as prescribed: Yes. Toleration medication: Yes. Family/Significant other contact made: No, will contact:  mother. Patient understands diagnosis: Yes. Discussing patient identified problems/goals with staff: Yes. Medical problems stabilized or resolved: No. Denies suicidal/homicidal ideation: Yes. Issues/concerns per patient self-inventory: Yes.  New problem(s) identified: Yes, Describe:  stressful home environment  New Short Term/Long Term Goal(s):  medication management for mood stabilization; elimination of SI thoughts; development of comprehensive mental wellness/sobriety plan.  Patient Goals: (Work on) "my OCD and worrying."  Discharge Plan or Barriers: Patient is interested in IOP or residential treatment for mental health. Current with [facility] for outpatient.   Reason for Continuation of Hospitalization: Anxiety Depression Medication stabilization  Estimated Length of Stay: 3-5 days  Attendees: Patient: Paul [name]  10/12/2019 10:46 AM  Physician: Marguerita Merles; Dr. Nehemiah Massed, MD 10/12/2019 10:46 AM  Nursing: Meriam Sprague RN; Will, RN 10/12/2019 10:46 AM  RN Care Manager: 10/12/2019 10:46 AM  Social Worker: Enid Cutter, LCSWA; [place], [place] 10/12/2019 10:46 AM  Recreational Therapist:  10/12/2019 10:46 AM  Other: Marciano Sequin,  NP 10/12/2019  10:46 AM  Other:  10/12/2019 10:46 AM  Other: 10/12/2019 10:46 AM    Scribe for Treatment Team: Marylee Floras, [place] 10/12/2019 10:46 AM

## 2019-10-12 NOTE — Progress Notes (Signed)
Recreation Therapy Notes  Date:  1.22.21 Time: 0930 Location: [address] Group Room  Group Topic: Stress Management  Goal Area(s) Addresses:  Patient will identify positive stress management techniques. Patient will identify benefits of using stress management post d/c.  Intervention: Stress Management  Activity : Guided Imagery.  LRT read script that guided patients on a calming retreat on the beach to enjoy the peaceful waves.  Patients were to listen as script was read to engage in activity.  Education:  Stress Management, Discharge Planning.   Education Outcome: Acknowledges Education  Clinical Observations/Feedback: Pt did not attend activity.    Caroll Rancher, LRT/CTRS         Caroll Rancher A 10/12/2019 10:55 AM

## 2019-10-12 NOTE — Progress Notes (Addendum)
[facility] MD Progress Note  10/12/2019 12:37 PM Paul [name]  MRN:  [id number] Subjective: Patient reports some improvement and today less severe anxiety.  Denies SI.  Denies medication side effects thus far.  Objective: I discussed case with treatment team and have met with patient  29 year old, identifies as transgender male to male.  Presented due to worsening mood and anxiety symptoms, described mainly as OCD type symptoms such as excessive handwashing, checking/rechecking behaviors.  Known to our unit from a prior admission in/2020 at which time presented for depression and OCD symptoms as well. At the time was discharged on Zoloft and trazodone.  Patient states stopped Zoloft soon after discharge due to vague side effects ("felt funny on it").  Was recently started on Celexa (about a week and a half ago).  Currently presents without psychomotor agitation or restlessness, describes partially improved anxiety although still presents vaguely anxious and worried that OCD symptoms will worsen once she returns home.  However, states she feels optimistic about medication "helping me".  We also reviewed likelihood that psychotherapy would help with symptom improvement.  Thus far tolerating Celexa well without side effects. At this time denies suicidal ideations and presents future oriented.  Today spoke about her plan to look for a job and eventually move out independently (currently lives with family) No disruptive or agitated behaviors on unit EKG - NSR , QTC 449  principal Problem: Obsessive compulsive disorder Diagnosis: Principal Problem:   Obsessive compulsive disorder Active Problems:   Severe recurrent major depression without psychotic features ([place])  Total Time spent with patient: 15 minutes  Past Psychiatric History: See admission H&P  Past Medical History:  Past Medical History:  Diagnosis Date  . Male-to-male transgender person   . Medical history non-contributory     Past  Surgical History:  Procedure Laterality Date  . NO PAST SURGERIES     Family History: History reviewed. No pertinent family history. Family Psychiatric  History: See admission H&P Social History:  Social History   Substance and Sexual Activity  Alcohol Use Not Currently     Social History   Substance and Sexual Activity  Drug Use Never    Social History   Socioeconomic History  . Marital status: Single    Spouse name: Not on file  . Number of children: Not on file  . Years of education: Not on file  . Highest education level: Not on file  Occupational History  . Not on file  Tobacco Use  . Smoking status: Never Smoker  . Smokeless tobacco: Never Used  Substance and Sexual Activity  . Alcohol use: Not Currently  . Drug use: Never  . Sexual activity: Not Currently  Other Topics Concern  . Not on file  Social History Narrative  . Not on file   Social Determinants of Health   Financial Resource Strain:   . Difficulty of Paying Living Expenses: Not on file  Food Insecurity:   . Worried About Charity fundraiser in the Last Year: Not on file  . Ran Out of Food in the Last Year: Not on file  Transportation Needs:   . Lack of Transportation (Medical): Not on file  . Lack of Transportation (Non-Medical): Not on file  Physical Activity:   . Days of Exercise per Week: Not on file  . Minutes of Exercise per Session: Not on file  Stress:   . Feeling of Stress : Not on file  Social Connections:   . Frequency of Communication with  Friends and Family: Not on file  . Frequency of Social Gatherings with Friends and Family: Not on file  . Attends Religious Services: Not on file  . Active Member of Clubs or Organizations: Not on file  . Attends Archivist Meetings: Not on file  . Marital Status: Not on file   Additional Social History:  Specify valuables returned: see belonging sheet Pain Medications: See MAR Prescriptions: See MAR Over the Counter: See  MAR History of alcohol / drug use?: Yes Longest period of sobriety (when/how long): unknown  Sleep: Good  Appetite:  Good  Current Medications: Current Facility-Administered Medications  Medication Dose Route Frequency Provider Last Rate Last Admin  . acetaminophen (TYLENOL) tablet 650 mg  650 mg Oral Q6H PRN Lindon Romp A, NP      . alum & mag hydroxide-simeth (MAALOX/MYLANTA) 200-200-20 MG/5ML suspension 30 mL  30 mL Oral Q4H PRN Lindon Romp A, NP      . citalopram (CELEXA) tablet 30 mg  30 mg Oral Daily Connye Burkitt, NP   30 mg at 10/12/19 7893  . dutasteride (AVODART) capsule 0.5 mg  0.5 mg Oral Daily Lindon Romp A, NP   0.5 mg at 10/11/19 0746  . feeding supplement (ENSURE ENLIVE) (ENSURE ENLIVE) liquid 237 mL  237 mL Oral BID BM Sharma Covert, MD   237 mL at 10/12/19 1100  . hydrOXYzine (ATARAX/VISTARIL) tablet 25 mg  25 mg Oral TID PRN Lindon Romp A, NP      . magnesium hydroxide (MILK OF MAGNESIA) suspension 30 mL  30 mL Oral Daily PRN Lindon Romp A, NP      . traZODone (DESYREL) tablet 50 mg  50 mg Oral QHS PRN Rozetta Nunnery, NP        Lab Results: No results found for this or any previous visit (from the past 56 hour(s)).  Blood Alcohol level:  Lab Results  Component Value Date   ETH <10 81/09/7508    Metabolic Disorder Labs: Lab Results  Component Value Date   HGBA1C 4.6 (L) 03/15/2019   MPG 85.32 03/15/2019   No results found for: PROLACTIN Lab Results  Component Value Date   CHOL 157 10/07/2019   TRIG 33 10/07/2019   HDL 69 10/07/2019   CHOLHDL 2.3 10/07/2019   VLDL 7 10/07/2019   LDLCALC 81 10/07/2019   LDLCALC 117 (H) 03/15/2019    Physical Findings: AIMS: Facial and Oral Movements Muscles of Facial Expression: None, normal Lips and Perioral Area: None, normal Jaw: None, normal Tongue: None, normal,Extremity Movements Upper (arms, wrists, hands, fingers): None, normal Lower (legs, knees, ankles, toes): None, normal, Trunk  Movements Neck, shoulders, hips: None, normal, Overall Severity Severity of abnormal movements (highest score from questions above): None, normal Incapacitation due to abnormal movements: None, normal Patient's awareness of abnormal movements (rate only patient's report): No Awareness, Dental Status Current problems with teeth and/or dentures?: No Does patient usually wear dentures?: No  CIWA:  CIWA-Ar Total: 3 COWS:  COWS Total Score: 2  Musculoskeletal: Strength & Muscle Tone: within normal limits Gait & Station: normal Patient leans: N/A  Psychiatric Specialty Exam: Physical Exam  Nursing note and vitals reviewed. Constitutional: He is oriented to person, place, and time. He appears well-developed and well-nourished.  Cardiovascular: Normal rate.  Respiratory: Effort normal.  Musculoskeletal:        General: Normal range of motion.  Neurological: He is alert and oriented to person, place, and time.    Review  of Systems  Constitutional: Negative.   Respiratory: Negative for cough and shortness of breath.   Gastrointestinal: Negative for diarrhea, nausea and vomiting.  Musculoskeletal: Negative for arthralgias.  Neurological: Negative for light-headedness and headaches.  Psychiatric/Behavioral: Positive for dysphoric mood and suicidal ideas (passive). Negative for agitation, behavioral problems, hallucinations, self-injury and sleep disturbance. The patient is nervous/anxious. The patient is not hyperactive.   No headache, no chest pain, no shortness of breath  Blood pressure 108/74, pulse 75, temperature 98.2 F (36.8 C), resp. rate 16, height '5\' 11"'  (1.803 m), weight 77.6 kg, SpO2 98 %.Body mass index is 23.85 kg/m.  General Appearance: Well Groomed  Eye Contact:  Good  Speech:  Normal Rate  Volume:  Normal  Mood:  Mood improving, anxiety persists but also appears somewhat improved today  Affect:  Remains anxious, affect more reactive  Thought Process:  Linear and  Descriptions of Associations: Intact  Orientation:  Full (Time, Place, and Person)  Thought Content:  Obsessive ideations, no hallucinations, no delusions  Suicidal Thoughts:  No currently denies suicidal plan or intention and contracts for safety on unit  Homicidal Thoughts:  No  Memory:  Recent and remote grossly intact  Judgement: Improving  Insight:  Fair/improving  Psychomotor Activity:  Normal-no psychomotor agitation  Concentration:  Concentration: Good and Attention Span: Good  Recall:  Good  Fund of Knowledge:  Good  Language:  Good  Akathisia:  No  Handed:  Right  AIMS (if indicated):     Assets:  Communication Skills Desire for Improvement Housing Resilience Social Support  ADL's:  Intact  Cognition:  WNL  Sleep:  Number of Hours: 6.75   Assessment:  28 year old, identifies as transgender male to male.  Presented due to worsening mood and anxiety symptoms, described mainly as OCD type symptoms such as excessive handwashing, checking/rechecking behaviors.  Known to our unit from a prior admission in/2020 at which time presented for depression and OCD symptoms as well. At the time was discharged on Zoloft and trazodone.  Patient states stopped Zoloft soon after discharge due to vague side effects ("felt funny on it").  Was recently started on Celexa (about a week and a half ago).  Patient is currently endorsing improved mood and partially improved anxiety/obsessive compulsive symptoms compared to admission.  Denies suicidal ideations and at this time presents future oriented, speaking of obtaining employment and moving out independently following discharge.  Tolerating Celexa trial well thus far without side effects.   Treatment Plan Summary: Daily contact with patient to assess and evaluate symptoms and progress in treatment and Medication management  Treatment plan reviewed as below today 1/22 Treatment team working on disposition planning options Continue Celexa 30 mg  PO daily for depression/anxiety Continue Adovart 0.5 mg PO daily  Continue Vistaril 25 mg PO TID PRN anxiety Continue Trazodone 50 mg PO QHS PRN insomnia Check EKG to monitor QTc interval  Jenne Campus, MD 10/12/2019, 12:37 PM   Patient ID: Paul [name], male   DOB: 1991-01-05, 29 y.o.   MRN: [id number]

## 2019-10-12 NOTE — Progress Notes (Addendum)
D:  Patient's self inventory sheet, patient sleeps good, no sleep medication.  Good appetite, normal energy level, good concentration.  Depression #3, hopeless 3, anxiety 6.  Denied withdrawals.  Denied SI.  Denied physical problems.  Denied physical pain.  Goal is to work on OCD and stay thankful and positive.   Plans to stay in prayerful state.  No discharge plans. A:  Medications administered per MD orders.  Emotional support and encouragement given patient. R:  Denied SI and HI, contracts for aety.  Denied A/V hallucinations.  Safety maintained with 15 minute checks.

## 2019-10-12 NOTE — Plan of Care (Signed)
Nurse discussed anxiety, depression and coping skills with patient.  

## 2019-10-12 NOTE — Progress Notes (Signed)
   10/12/19 2045  Psych Admission Type (Psych Patients Only)  Admission Status Voluntary  Psychosocial Assessment  Patient Complaints Anxiety;Depression  Eye Contact Fair  Facial Expression Anxious;Sullen;Worried  Affect Anxious;Depressed  Surveyor, minerals Activity Slow  Appearance/Hygiene In scrubs;Unremarkable  Behavior Characteristics Appropriate to situation;Cooperative;Anxious  Mood Anxious;Preoccupied;Pleasant  Thought Process  Coherency WDL  Content WDL  Delusions None reported or observed  Perception WDL  Hallucination None reported or observed  Judgment WDL  Confusion None  Danger to Self  Current suicidal ideation? Denies  Self-Injurious Behavior No self-injurious ideation or behavior indicators observed or expressed   Agreement Not to Harm Self Yes  Description of Agreement verbally contracts for safety  Danger to Others  Danger to Others None reported or observed

## 2019-10-13 NOTE — [facility] Suicide Risk Assessment (Signed)
[facility] Discharge Suicide Risk Assessment   Principal Problem: Obsessive compulsive disorder Discharge Diagnoses: Principal Problem:   Obsessive compulsive disorder Active Problems:   Severe recurrent major depression without psychotic features ([facility])   Total Time spent with patient: 30 minutes  Musculoskeletal: Strength & Muscle Tone: within normal limits Gait & Station: normal Patient leans: N/A  Psychiatric Specialty Exam: Review of Systems denies headache, no chest pain, no shortness of breath, no vomiting   Blood pressure 108/74, pulse 75, temperature 98.2 F (36.8 C), resp. rate 16, height 5\' 11"  (1.803 m), weight 77.6 kg, SpO2 98 %.Body mass index is 23.85 kg/m.  General Appearance: Well Groomed  Eye Contact::  Good  Speech:  Normal Rate409  Volume:  Normal  Mood:  reports mood as " a lot better today"  Affect:  reactive, appropriate, less anxious  Thought Process:  Linear and Descriptions of Associations: Intact  Orientation:  Other:  fully alert and attentive   Thought Content:  no hallucinations, no delusions, not internally preoccupied   Suicidal Thoughts:  No denies suicidal or self injurious ideations, denies homicidal or violent ideations  Homicidal Thoughts:  No  Memory:  recent and remote grossly intact   Judgement:  Other:  improving   Insight:  improving \  Psychomotor Activity:  Normal  Concentration:  Good  Recall:  Good  Fund of Knowledge:Good  Language: Good  Akathisia:  Negative  Handed:  Right  AIMS (if indicated):     Assets:  Communication Skills Desire for Improvement Resilience  Sleep:  Number of Hours: 6.75  Cognition: WNL  ADL's:  Intact   Mental Status Per Nursing Assessment::   On Admission:  NA  Demographic Factors:  29 year old, transgender male to male, lives with family  Loss Factors: No specific stressors identified   Historical Factors: History of depression and of OCD symptoms, history of prior psychiatric admission for  depression, suicidal ideations  Risk Reduction Factors:   Sense of responsibility to family, Living with another person, especially a relative, Positive social support and Positive coping skills or problem solving skills  Continued Clinical Symptoms:  At this time patient is alert, attentive, pleasant on approach, describes mood as improving and states " today I am feeling a lot better", affect is reactive/fuller in range and today presents less anxious , no thought disorder, no suicidal or self injurious ideations, no homicidal or violent ideations, no psychotic symptoms. Chronic obsessive ideations, reports these have improved since admission. Behavior on unit calm and in good control.  Denies medication side effects- side effects reviewed .  Cognitive Features That Contribute To Risk:  No gross cognitive deficits noted upon discharge. Is alert , attentive, and oriented x 3   Suicide Risk:  Mild:  Suicidal ideation of limited frequency, intensity, duration, and specificity.  There are no identifiable plans, no associated intent, mild dysphoria and related symptoms, good self-control (both objective and subjective assessment), few other risk factors, and identifiable protective factors, including available and accessible social support.  Follow-up Information    [facility]. Go on 10/16/2019.   Why: You are scheduled for an appointment on 10/16/19 at 8:30 am with 10/18/19.  Your appointment will last approximately one hour.  Please bring your current insurance information and your discharge summary from this hospitalization with you.  Contact information: [address] [place] [phone]           Plan Of Care/Follow-up recommendations:  Activity:  as tolerated Diet:  regular Tests:  NA Other:  See below  Patient is expressing readiness for discharge- leaving unit in good spirits  Plans to return home Plans to follow up as above   Jenne Campus, MD 10/13/2019, 11:37 AM

## 2019-10-13 NOTE — Progress Notes (Signed)
[place] NOVEL CORONAVIRUS (COVID-19) DAILY CHECK-OFF SYMPTOMS - answer yes or no to each - every day NO YES  Have you had a fever in the past 24 hours?  . Fever (Temp > 37.80C / 100F) X   Have you had any of these symptoms in the past 24 hours? . New Cough .  Sore Throat  .  Shortness of Breath .  Difficulty Breathing .  Unexplained Body Aches   X   Have you had any one of these symptoms in the past 24 hours not related to allergies?   . Runny Nose .  Nasal Congestion .  Sneezing   X   If you have had runny nose, nasal congestion, sneezing in the past 24 hours, has it worsened?  X   EXPOSURES - check yes or no X   Have you traveled outside the state in the past 14 days?  X   Have you been in contact with someone with a confirmed diagnosis of COVID-19 or PUI in the past 14 days without wearing appropriate PPE?  X   Have you been living in the same home as a person with confirmed diagnosis of COVID-19 or a PUI (household contact)?    X   Have you been diagnosed with COVID-19?    X              What to do next: Answered NO to all: Answered YES to anything:   Proceed with unit schedule Follow the [facility] Inpatient Flowsheet.   

## 2019-10-13 NOTE — Discharge Summary (Addendum)
Physician Discharge Summary Note  Patient:  Paul [name] is an 29 y.o., male MRN:  [id number] DOB:  Jun 06, 1991 Patient phone:  [phone] (home)  Patient address:   [address] [place] [address],  Total Time spent with patient: 15 minutes  Date of Admission:  10/06/2019 Date of Discharge: 10/13/2019  Reason for Admission:  OCD with suicidal ideation  Principal Problem: Obsessive compulsive disorder Discharge Diagnoses: Principal Problem:   Obsessive compulsive disorder Active Problems:   Severe recurrent major depression without psychotic features (HCC)   Past Psychiatric History: History of OCD and depression. He had previously attempted suicide in June 2020 with a large dose of heroin to kill himself.  He was hospitalized at our facility at that time.  He was discharged on Zoloft.  He stated the Zoloft made his "head feel funny" and he stopped it.    Past Medical History:  Past Medical History:  Diagnosis Date  . Male-to-male transgender person   . Medical history non-contributory     Past Surgical History:  Procedure Laterality Date  . NO PAST SURGERIES     Family History: History reviewed. No pertinent family history. Family Psychiatric  History: Unknown. Patient is adopted. Social History:  Social History   Substance and Sexual Activity  Alcohol Use Not Currently     Social History   Substance and Sexual Activity  Drug Use Never    Social History   Socioeconomic History  . Marital status: Single    Spouse name: Not on file  . Number of children: Not on file  . Years of education: Not on file  . Highest education level: Not on file  Occupational History  . Not on file  Tobacco Use  . Smoking status: Never Smoker  . Smokeless tobacco: Never Used  Substance and Sexual Activity  . Alcohol use: Not Currently  . Drug use: Never  . Sexual activity: Not Currently  Other Topics Concern  . Not on file  Social History Narrative  . Not on file    Social Determinants of Health   Financial Resource Strain:   . Difficulty of Paying Living Expenses: Not on file  Food Insecurity:   . Worried About Programme researcher, broadcasting/film/video in the Last Year: Not on file  . Ran Out of Food in the Last Year: Not on file  Transportation Needs:   . Lack of Transportation (Medical): Not on file  . Lack of Transportation (Non-Medical): Not on file  Physical Activity:   . Days of Exercise per Week: Not on file  . Minutes of Exercise per Session: Not on file  Stress:   . Feeling of Stress : Not on file  Social Connections:   . Frequency of Communication with Friends and Family: Not on file  . Frequency of Social Gatherings with Friends and Family: Not on file  . Attends Religious Services: Not on file  . Active Member of Clubs or Organizations: Not on file  . Attends Banker Meetings: Not on file  . Marital Status: Not on file    Hospital Course:  From admission H&P: Patient is a 29 year old transgender male to male who presented to the behavioral health hospital yesterday as a walk-in evaluation.  Patient stated that his mood and anxiety symptoms were worsening, and that his obsessive-compulsive disorder symptoms were poorly controlled.  He stated that recently the obsessive-compulsive symptoms were taking over his life, and he was "hyperfocus" on things and spent hours in his  home cleaning.  He stated he was feeling guilty about previous "sins" that he had had in the past, and that he had decided he no longer wanted to live.  He had previously attempted suicide in June 2020 with a large dose of heroin to kill himself.  He was hospitalized at our facility at that time.  He was discharged on Zoloft.  He stated the Zoloft made his "head feel funny" and he stopped it.  He stated that he had been placed on Celexa 10 mg a day recently.  He stated he did not find that that was effective at this point.  He stated he is being seen by his family practice  clinic, and goes to [facility] for his hormones for his transgender issues.  He admitted to helplessness, hopelessness and worthlessness.  He admitted to suicidal ideation.  He was admitted to the hospital for evaluation and stabilization.  Michial was admitted for OCD symptoms with suicidal ideation. He remained on the [facility] unit for seven days. Celexa was increased. He participated in group therapy on the unit. He responded well to treatment with no adverse effects reported. He has shown improved mood, affect, sleep, and interaction. He denies any SI/HI/AVH and contracts for safety. He is discharging on the medications listed below. He agrees to follow up at [facility] (see below). Patient is provided with prescriptions for medications upon discharge. He is discharging home with his mother.  Physical Findings: AIMS: Facial and Oral Movements Muscles of Facial Expression: None, normal Lips and Perioral Area: None, normal Jaw: None, normal Tongue: None, normal,Extremity Movements Upper (arms, wrists, hands, fingers): None, normal Lower (legs, knees, ankles, toes): None, normal, Trunk Movements Neck, shoulders, hips: None, normal, Overall Severity Severity of abnormal movements (highest score from questions above): None, normal Incapacitation due to abnormal movements: None, normal Patient's awareness of abnormal movements (rate only patient's report): No Awareness, Dental Status Current problems with teeth and/or dentures?: No Does patient usually wear dentures?: No  CIWA:  CIWA-Ar Total: 1 COWS:  COWS Total Score: 1  Musculoskeletal: Strength & Muscle Tone: within normal limits Gait & Station: normal Patient leans: N/A  Psychiatric Specialty Exam: Physical Exam  Nursing note and vitals reviewed. Constitutional: He is oriented to person, place, and time. He appears well-developed and well-nourished.  Cardiovascular: Normal rate.  Respiratory: Effort normal.  Neurological: He is alert  and oriented to person, place, and time.    Review of Systems  Constitutional: Negative.   Respiratory: Negative for cough and shortness of breath.   Psychiatric/Behavioral: Negative for agitation, behavioral problems, decreased concentration, dysphoric mood, hallucinations, self-injury, sleep disturbance and suicidal ideas. The patient is not nervous/anxious and is not hyperactive.     Blood pressure 108/74, pulse 75, temperature 98.2 F (36.8 C), resp. rate 16, height 5\' 11"  (1.803 m), weight 77.6 kg, SpO2 98 %.Body mass index is 23.85 kg/m.  See MD's discharge SRA      Has this patient used any form of tobacco in the last 30 days? (Cigarettes, Smokeless Tobacco, Cigars, and/or Pipes)  No  Blood Alcohol level:  Lab Results  Component Value Date   ETH <10 72/53/6644    Metabolic Disorder Labs:  Lab Results  Component Value Date   HGBA1C 4.6 (L) 03/15/2019   MPG 85.32 03/15/2019   No results found for: PROLACTIN Lab Results  Component Value Date   CHOL 157 10/07/2019   TRIG 33 10/07/2019   HDL 69 10/07/2019   CHOLHDL 2.3 10/07/2019  VLDL 7 10/07/2019   LDLCALC 81 10/07/2019   LDLCALC 117 (H) 03/15/2019    See Psychiatric Specialty Exam and Suicide Risk Assessment completed by Attending Physician prior to discharge.  Discharge destination:  Home  Is patient on multiple antipsychotic therapies at discharge:  No   Has Patient had three or more failed trials of antipsychotic monotherapy by history:  No  Recommended Plan for Multiple Antipsychotic Therapies: NA  Discharge Instructions    [facility] pharmacy consult for medication samples   Complete by: As directed    7 days worth   Discharge instructions   Complete by: As directed    Patient is instructed to take all prescribed medications as recommended. Report any side effects or adverse reactions to your outpatient psychiatrist. Patient is instructed to abstain from alcohol and illegal drugs while on prescription  medications. In the event of worsening symptoms, patient is instructed to call the crisis hotline, 911, or go to the nearest emergency department for evaluation and treatment.     Allergies as of 10/13/2019   No Known Allergies     Medication List    STOP taking these medications   acetaminophen 325 MG tablet Commonly known as: TYLENOL   amphetamine-dextroamphetamine 10 MG tablet Commonly known as: ADDERALL   sertraline 25 MG tablet Commonly known as: ZOLOFT     TAKE these medications     Indication  citalopram 10 MG tablet Commonly known as: CELEXA Take 3 tablets (30 mg total) by mouth daily. Take daily for depression and OCD. What changed:   how much to take  additional instructions  Indication: Depression, Obsessive Compulsive Disorder   dutasteride 0.5 MG capsule Commonly known as: AVODART Take 0.5 mg by mouth daily.  Indication: Benign Enlargement of Prostate, Per PCP, for transition   ESTRADIOL VALERATE IM Inject 0.5 mLs into the muscle every Saturday. 100mg /16mL Concentration  Indication: Per PCP, hormone for transition   hydrOXYzine 25 MG tablet Commonly known as: ATARAX/VISTARIL Take 1 tablet (25 mg total) by mouth 3 (three) times daily as needed for anxiety.  Indication: Feeling Anxious   ibuprofen 400 MG tablet Commonly known as: ADVIL Take 400 mg by mouth every 6 (six) hours as needed for headache or mild pain.  Indication: headache   traZODone 50 MG tablet Commonly known as: DESYREL Take 1 tablet (50 mg total) by mouth at bedtime as needed for sleep.  Indication: Trouble Sleeping      Follow-up Information    [facility]. Go on 10/16/2019.   Why: You are scheduled for an appointment on 10/16/19 at 8:30 am with 10/18/19.  Your appointment will last approximately one hour.  Please bring your current insurance information and your discharge summary from this hospitalization with you.  Contact information: [address] [place] [phone]           Follow-up recommendations: Activity as tolerated. Diet as recommended by primary care physician. Keep all scheduled follow-up appointments as recommended.   Comments:   Patient is instructed to take all prescribed medications as recommended. Report any side effects or adverse reactions to your outpatient psychiatrist. Patient is instructed to abstain from alcohol and illegal drugs while on prescription medications. In the event of worsening symptoms, patient is instructed to call the crisis hotline, 911, or go to the nearest emergency department for evaluation and treatment.  Signed: [phone], NP 10/13/2019, 10:21 AM   Patient seen, Suicide Assessment Completed.  Disposition  Plan Reviewed

## 2019-10-13 NOTE — Progress Notes (Signed)
  [facility] Adult Case Management Discharge Plan :  Will you be returning to the same living situation after discharge:  Yes,  with mother At discharge, do you have transportation home?: Yes,  arranged by patient Do you have the ability to pay for your medications: Yes,  has insurance  Release of information consent forms completed and emailed to Medical Records, then turned in to Medical Records by CSW.   Patient to Follow up at: Follow-up Information    [facility]. Go on 10/16/2019.   Why: You are scheduled for an appointment on 10/16/19 at 8:30 am with Ellison Carwin.  Your appointment will last approximately one hour.  Please bring your current insurance information and your discharge summary from this hospitalization with you.  Contact information: [address] [place] [address] [phone]           Next level of care provider has access to [facility] Link:no  Safety Planning and Suicide Prevention discussed: Yes,  with mother     Has patient been referred to the Quitline?: Patient refused referral  Patient has been referred for addiction treatment: Yes  Lynnell Chad, LCSW 10/13/2019, 11:17 AM

## 2019-10-13 NOTE — Progress Notes (Signed)
   10/13/19 0900  Psych Admission Type (Psych Patients Only)  Admission Status Voluntary  Psychosocial Assessment  Patient Complaints None  Eye Contact Fair  Facial Expression Animated  Affect Appropriate to circumstance  Speech Logical/coherent  Interaction Assertive  Motor Activity Other (Comment) (wdl)  Appearance/Hygiene Unremarkable  Behavior Characteristics Appropriate to situation  Mood Pleasant  Thought Process  Coherency WDL  Content WDL  Delusions None reported or observed  Perception WDL  Hallucination None reported or observed  Judgment WDL  Confusion None  Danger to Self  Current suicidal ideation? Denies  Description of Suicide Plan none  Self-Injurious Behavior No self-injurious ideation or behavior indicators observed or expressed   Agreement Not to Harm Self Yes  Description of Agreement verbally contracts  Danger to Others  Danger to Others None reported or observed

## 2019-10-13 NOTE — [facility] Group Notes (Addendum)
LCSW Group Therapy Note  10/13/2019   10:00-11:00am   Type of Therapy and Topic:  Group Therapy: Anger Cues and Responses  Participation Level:  Active   Description of Group:   In this group, patients learned how to recognize the physical, cognitive, emotional, and behavioral responses they have to anger-provoking situations.  They identified a recent time they became angry and how they reacted.  They analyzed how their reaction was possibly beneficial and how it was possibly unhelpful.  The group discussed a variety of healthier coping skills that could help with such a situation in the future.  Focus was placed on how helpful it is to recognize the underlying emotions to our anger, because working on those can lead to a more permanent solution as well as our ability to focus on the important rather than the urgent.  Therapeutic Goals: 1. Patients will remember their last incident of anger and how they felt emotionally and physically, what their thoughts were at the time, and how they behaved. 2. Patients will identify how their behavior at that time worked for them, as well as how it worked against them. 3. Patients will explore possible new behaviors to use in future anger situations. 4. Patients will learn that anger itself is normal and cannot be eliminated, and that healthier reactions can assist with resolving conflict rather than worsening situations.  Summary of Patient Progress:  The patient shared that his most recent time of anger was the day of his stepfather's funeral and said he reacted badly to something his mother said and exploded.  He stated he tends to be very impulsive in his anger, will say things he does not mean and cannot take back.  He also said that when he is angry and explodes in anger, he often will talk about being suicidal at the same time.  Therapeutic Modalities:   Cognitive Behavioral Therapy  Lynnell Chad

## 2019-10-13 NOTE — Progress Notes (Signed)
Nursing discharge note: Patient discharged home per MD order.  Patient received all personal belongings from unit and locker.  Reviewed AVS/transition record with patient and he/she indicated understanding.  Patient denies any SI/HI/AVH.  He will meet with SW re outpatient services upon discharge.  Patient's mother will be arriving around 1300 to pick him up.
